# Patient Record
Sex: Female | Born: 1965 | Race: Black or African American | Hispanic: No | Marital: Single | Smoking: Never smoker
Health system: Southern US, Community
[De-identification: ages and names within clinical notes are randomized; demographics above are authoritative.]

## PROBLEM LIST (undated history)

## (undated) DIAGNOSIS — D509 Iron deficiency anemia, unspecified: Secondary | ICD-10-CM

## (undated) DIAGNOSIS — E119 Type 2 diabetes mellitus without complications: Secondary | ICD-10-CM

## (undated) DIAGNOSIS — I10 Essential (primary) hypertension: Secondary | ICD-10-CM

## (undated) HISTORY — PX: BACK SURGERY: SHX140

## (undated) HISTORY — DX: Type 2 diabetes mellitus without complications: E11.9

## (undated) HISTORY — DX: Iron deficiency anemia, unspecified: D50.9

## (undated) HISTORY — PX: CHOLECYSTECTOMY: SHX55

## (undated) HISTORY — PX: TUBAL LIGATION: SHX77

---

## 2012-05-16 ENCOUNTER — Emergency Department (HOSPITAL_BASED_OUTPATIENT_CLINIC_OR_DEPARTMENT_OTHER)
Admission: EM | Admit: 2012-05-16 | Discharge: 2012-05-16 | Disposition: A | Discharge status: Home / Self Care | Payer: BC Managed Care – PPO | Attending: Emergency Medicine | Admitting: Emergency Medicine

## 2012-05-16 ENCOUNTER — Encounter (HOSPITAL_BASED_OUTPATIENT_CLINIC_OR_DEPARTMENT_OTHER): Payer: Self-pay

## 2012-05-16 DIAGNOSIS — M549 Dorsalgia, unspecified: Secondary | ICD-10-CM | POA: Insufficient documentation

## 2012-05-16 DIAGNOSIS — I10 Essential (primary) hypertension: Secondary | ICD-10-CM | POA: Insufficient documentation

## 2012-05-16 DIAGNOSIS — E119 Type 2 diabetes mellitus without complications: Secondary | ICD-10-CM | POA: Insufficient documentation

## 2012-05-16 DIAGNOSIS — D649 Anemia, unspecified: Secondary | ICD-10-CM | POA: Insufficient documentation

## 2012-05-16 DIAGNOSIS — R1013 Epigastric pain: Secondary | ICD-10-CM | POA: Insufficient documentation

## 2012-05-16 DIAGNOSIS — Z79899 Other long term (current) drug therapy: Secondary | ICD-10-CM | POA: Insufficient documentation

## 2012-05-16 HISTORY — DX: Essential (primary) hypertension: I10

## 2012-05-16 HISTORY — DX: Type 2 diabetes mellitus without complications: E11.9

## 2012-05-16 LAB — COMPREHENSIVE METABOLIC PANEL
AST: 16 U/L (ref 0–37)
Albumin: 3.4 g/dL — ABNORMAL LOW (ref 3.5–5.2)
Alkaline Phosphatase: 78 U/L (ref 39–117)
CO2: 24 mEq/L (ref 19–32)
Chloride: 102 mEq/L (ref 96–112)
Potassium: 3.6 mEq/L (ref 3.5–5.1)
Total Bilirubin: 0.1 mg/dL — ABNORMAL LOW (ref 0.3–1.2)

## 2012-05-16 LAB — URINALYSIS, ROUTINE W REFLEX MICROSCOPIC
Glucose, UA: NEGATIVE mg/dL
Ketones, ur: NEGATIVE mg/dL
Protein, ur: 100 mg/dL — AB

## 2012-05-16 LAB — CBC WITH DIFFERENTIAL/PLATELET
Basophils Absolute: 0.1 10*3/uL (ref 0.0–0.1)
Eosinophils Absolute: 0.2 10*3/uL (ref 0.0–0.7)
HCT: 32 % — ABNORMAL LOW (ref 36.0–46.0)
Lymphs Abs: 3.1 10*3/uL (ref 0.7–4.0)
MCH: 22.1 pg — ABNORMAL LOW (ref 26.0–34.0)
MCHC: 32.5 g/dL (ref 30.0–36.0)
MCV: 67.9 fL — ABNORMAL LOW (ref 78.0–100.0)
Monocytes Absolute: 0.9 10*3/uL (ref 0.1–1.0)
Neutro Abs: 6.4 10*3/uL (ref 1.7–7.7)
RDW: 16.2 % — ABNORMAL HIGH (ref 11.5–15.5)

## 2012-05-16 LAB — URINE MICROSCOPIC-ADD ON

## 2012-05-16 LAB — TROPONIN I: Troponin I: <0.3 ng/mL (ref <0.30)

## 2012-05-16 MED ORDER — HYDROCODONE-ACETAMINOPHEN 5-325 MG PO TABS: 2.0000 | ORAL_TABLET | ORAL | Status: DC | PRN

## 2012-05-16 MED ORDER — OMEPRAZOLE 20 MG PO CPDR: 20.0000 mg | DELAYED_RELEASE_CAPSULE | Freq: Every day | ORAL | Status: DC

## 2012-05-16 NOTE — ED Notes (Signed)
Pt reports onset of left upper back pain 2 days ago and developed epigastric pain last night after eating ribs and potato salad.

## 2012-05-16 NOTE — ED Provider Notes (Signed)
History     CSN: [id number]  Arrival date & time 05/16/12  4098   First MD Initiated Contact with Patient 05/16/12 0840      Chief Complaint  Patient presents with  . Abdominal Pain  . Back Pain     HPI Patient comes in with two-day history of epigastric and back pain.  Patient has previous history of gallbladder disease but had her gallbladder removed.  She denies any fever chills.  She has noted some dark stools the last few days.  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  Patient denies any chest pain.  She did have a heavy meal of ribs and otitis last night prior to the problem. Past Medical History  Diagnosis Date  . Diabetes mellitus without complication   . Hypertension     Past Surgical History  Procedure Laterality Date  . Back surgery    . Cholecystectomy    . Cesarean section      No family history on file.  History  Substance Use Topics  . Smoking status: Never Smoker   . Smokeless tobacco: Not on file  . Alcohol Use: Yes     Comment: occasional    OB History   Grav Para Term Preterm Abortions TAB SAB Ect Mult Living                  Review of Systems Dark stools All other systems reviewed and are negative Allergies  Review of patient's allergies indicates no known allergies.  Home Medications   Current Outpatient Rx  Name  Route  Sig  Dispense  Refill  . HYDROcodone-acetaminophen (NORCO/VICODIN) 5-325 MG per tablet   Oral   Take 2 tablets by mouth every 4 (four) hours as needed for pain.   10 tablet   0   . LISINOPRIL PO   Oral   Take by mouth.         . METFORMIN HCL PO   Oral   Take by mouth.         Marland Kitchen omeprazole (PRILOSEC) 20 MG capsule   Oral   Take 1 capsule (20 mg total) by mouth daily.   30 capsule   0     BP 136/56  Pulse 65  Temp(Src) 97.9 F (36.6 C) (Oral)  Resp 18  Ht 5\' 1"  (1.549 m)  Wt 191 lb (86.637 kg)  BMI 36.11 kg/m2  SpO2 98%  Physical Exam  Nursing note and  vitals reviewed. Constitutional: She is oriented to person, place, and time. She appears well-developed and well-nourished. No distress.  HENT:  Head: Normocephalic and atraumatic.  Eyes: Pupils are equal, round, and reactive to light.  Neck: Normal range of motion.  Cardiovascular: Normal rate and intact distal pulses.   Pulmonary/Chest: No respiratory distress.  Abdominal: Soft. Normal appearance and bowel sounds are normal. She exhibits no distension. There is tenderness (Mild). There is no rebound and no guarding.    Mild  Musculoskeletal: Normal range of motion.  Neurological: She is alert and oriented to person, place, and time. No cranial nerve deficit.  Skin: Skin is warm and dry. No rash noted.  Psychiatric: She has a normal mood and affect. Her behavior is normal.    ED Course  Procedures (including critical care time)  Date: 05/16/2012  Rate: 66  Rhythm: normal sinus rhythm  QRS Axis: normal  Intervals: normal  ST/T Wave abnormalities: normal  Conduction Disutrbances: Forward  progression anteriorly  Narrative Interpretation: Nonspecific changes     Labs Reviewed  COMPREHENSIVE METABOLIC PANEL - Abnormal; Notable for the following:    Albumin 3.4 (*)    Total Bilirubin 0.1 (*)    All other components within normal limits  CBC WITH DIFFERENTIAL - Abnormal; Notable for the following:    WBC 10.7 (*)    Hemoglobin 10.4 (*)    HCT 32.0 (*)    MCV 67.9 (*)    MCH 22.1 (*)    RDW 16.2 (*)    Platelets 445 (*)    All other components within normal limits  URINALYSIS, ROUTINE W REFLEX MICROSCOPIC - Abnormal; Notable for the following:    Hgb urine dipstick TRACE (*)    Protein, ur 100 (*)    All other components within normal limits  URINE MICROSCOPIC-ADD ON - Abnormal; Notable for the following:    Squamous Epithelial / LPF FEW (*)    All other components within normal limits  LIPASE, BLOOD  TROPONIN I  OCCULT BLOOD X 1 CARD TO LAB, STOOL   No results  found.   1. Epigastric pain   2. Anemia       MDM   And states that she was told she had anemia in the past.       Nelia Shi, MD 05/18/12 1131

## 2012-05-17 ENCOUNTER — Telehealth: Payer: Self-pay | Admitting: Hematology & Oncology

## 2012-05-17 NOTE — Telephone Encounter (Signed)
Pt aware of 5-12 appointment. Mailed map, calendar to pt. MD aware it's 5-12

## 2012-06-25 ENCOUNTER — Other Ambulatory Visit (HOSPITAL_BASED_OUTPATIENT_CLINIC_OR_DEPARTMENT_OTHER): Payer: Self-pay | Admitting: Lab

## 2012-06-25 ENCOUNTER — Ambulatory Visit (HOSPITAL_BASED_OUTPATIENT_CLINIC_OR_DEPARTMENT_OTHER): Payer: Self-pay | Admitting: Hematology & Oncology

## 2012-06-25 ENCOUNTER — Telehealth: Payer: Self-pay | Admitting: Hematology & Oncology

## 2012-06-25 ENCOUNTER — Ambulatory Visit: Payer: Self-pay

## 2012-06-25 VITALS — BP 146/61 | HR 65 | Temp 98.0°F | Resp 16 | Ht 61.0 in | Wt 189.0 lb

## 2012-06-25 DIAGNOSIS — E119 Type 2 diabetes mellitus without complications: Secondary | ICD-10-CM

## 2012-06-25 DIAGNOSIS — N189 Chronic kidney disease, unspecified: Secondary | ICD-10-CM

## 2012-06-25 DIAGNOSIS — D631 Anemia in chronic kidney disease: Secondary | ICD-10-CM

## 2012-06-25 DIAGNOSIS — D509 Iron deficiency anemia, unspecified: Secondary | ICD-10-CM

## 2012-06-25 LAB — CBC WITH DIFFERENTIAL (CANCER CENTER ONLY)
BASO#: 0 10*3/uL (ref 0.0–0.2)
Eosinophils Absolute: 0.3 10*3/uL (ref 0.0–0.5)
HGB: 10.3 g/dL — ABNORMAL LOW (ref 11.6–15.9)
LYMPH%: 28.6 % (ref 14.0–48.0)
MCH: 22.2 pg — ABNORMAL LOW (ref 26.0–34.0)
MCV: 71 fL — ABNORMAL LOW (ref 81–101)
MONO%: 6 % (ref 0.0–13.0)
NEUT%: 62 % (ref 39.6–80.0)
Platelets: 424 10*3/uL — ABNORMAL HIGH (ref 145–400)
RBC: 4.64 10*6/uL (ref 3.70–5.32)

## 2012-06-25 NOTE — Progress Notes (Signed)
This office note has been dictated.

## 2012-06-25 NOTE — Telephone Encounter (Signed)
Pt here today and had no insurance information to provide. However, pt said she has applied for [employer] insurance w an efc date of 05/15/2012, but has not gotten the card as of date. They told her they are behind on sending the elig cards. Pt was offered the EPP and MCD assistance program and said she will let me know when she wants to apply.

## 2012-06-26 LAB — FERRITIN: Ferritin: 38 ng/mL (ref 10–291)

## 2012-06-26 LAB — ERYTHROPOIETIN: Erythropoietin: 11.5 m[IU]/mL (ref 2.6–18.5)

## 2012-06-26 LAB — IRON AND TIBC: UIBC: 275 ug/dL (ref 125–400)

## 2012-06-27 NOTE — Progress Notes (Signed)
CC:   Bernerd Limbo, MD, Fax [phone]  DIAGNOSIS:  Microcytic anemia.  HISTORY OF PRESENT ILLNESS:  Michelle Mercer is a very nice 47 year old African American female.  She has had diabetes now for over 20 years. She is on insulin and metformin.  She has had no kidney issues as far as she knows.  She has had no neuropathy.  She has been found to have some anemia.  She is followed by Dr. Bernerd Limbo at [facility].  She has been noted to have some anemia.  As such, it is felt that she needed to be evaluated for this.  She says she is going to, I think, [facility] for an upper endoscopy and lower endoscopy within the month.  Going back to December 2013, a CBC was done which showed white cell count 6.6, hemoglobin 10.7, hematocrit 34, and platelet count was 458. MCV was 68.  She had normal BUN and creatinine.  Again, she has had no obvious issues with bleeding or bruising.  On in March 2014, her hemoglobin was 10.9 and hematocrit 34.2.  MCV was 67.  Platelet count was 443.  She was started on some oral iron.  Again, she has had no change in bowel or bladder habits.  There has been no melena or bright red blood per rectum.  She has had no weight loss or weight gain.  She has had no obvious thyroid issues.  She has had no fever, sweats, or chills.  As far as she knows, she does not have sickle cell.  She has had 2 children and they, I am sure, were tested and they did not show any sickle cell.  She does not chew ice.  She has no "cravings."  She does not have her monthly cycles.  She says she may have 1 a year.  Again, she was kindly referred to the [facility] for an evaluation.  PAST MEDICAL HISTORY: 1. Insulin-dependent diabetes. 2. Cholecystectomy. 3. Hyperlipidemia. 4. Hypertension.  ALLERGIES: 1. Bactrim. 2. Lisinopril. 3. Prevacid.  MEDICATIONS: 1. Vitamin B12 1000 mcg p.o. daily. 2. Humalog (75/25) 40 units subcu q.a.m. and  80 units subcu q.p.m. 3. Lisinopril 20 mg p.o. daily. 4. Metformin ER 1000 mg p.o. b.i.d.  SOCIAL HISTORY:  Negative for tobacco use.  She has rare alcohol use. She has no obvious occupational exposures.  FAMILY HISTORY:  Remarkable for diabetes on her father's side of the family.  There is a history of CVA.  REVIEW OF SYSTEMS:  There are no additional findings as noted in the history present illness.  PHYSICAL EXAMINATION:  General:  This is a well-developed, well- nourished African American female in no obvious distress.  Vital signs: Temperature of 98, pulse 65, respiratory rate 16, blood pressure 146/61. Weight is 189.  Head and neck:  Normocephalic, atraumatic skull.  There are no ocular or oral lesions.  There are no palpable cervical or supraclavicular lymph nodes.  Lungs:  Clear to percussion and auscultation bilaterally.  Cardiac:  Regular rate and rhythm with a normal S1 and S2.  There are no murmurs, rubs, or bruits.  Abdomen: Soft with good bowel sounds.  There is no palpable abdominal mass. There is no fluid wave.  There is no palpable hepatosplenomegaly.  Back: No tenderness over the spine, ribs, or hips.  Extremities:  No clubbing, cyanosis, or edema.  Neurological:  No focal neurological deficits.  LABORATORY STUDIES:  White cell count is 10.5, hemoglobin 10.3, hematocrit  32.9, platelet count 424.  MCV is 71.  Peripheral smear shows mild anisocytosis.  She has microcytic red cells. There are no schistocytes.  There may be a rare target cell.  I see no spherocytes.  She has no nucleated red cells of teardrop cells.  There is no rouleaux formation.  White cells are adequate in maturity.  There are no hypersegmented polys.  I see no blasts.  There are no immature myeloid cells.  Lymphocytes do not see any atypical lymphocytes. Platelets are adequate in number and size.  IMPRESSION:  Michelle Mercer is a 47 year old African American female with microcytic anemia.  One  has to believe that this is going to be iron deficiency.  I do not see anything that would suggest sickle cell.  I did send off a hemoglobin electrophoresis on her.  If she does have iron deficiency, we will go ahead and give her IV iron.  She is taking oral iron right now.  I really do not see that this is helping her out.  I do not see a need for a bone marrow biopsy at this point in time.  I cannot find anything on her physical or on her blood smear that would suggest an actual bone marrow disorder.  We will go ahead and plan to get her back once we know what her iron studies look like.  Again, if her iron is low, we can get her in this week or next for IV iron.  Michelle Mercer was very charming.  I so enjoyed talking with her.  We likely will plan to get her back in 6-8 weeks so that we can follow up with her blood.  I think the "?" is going to be whether or not she has a low erythropoietin level.  I will add this to her lab work as if she may need ESA intervention if we cannot get her anemia improved with iron alone.    ______________________________ Josph Macho, M.D. PRE/MEDQ  D:  06/25/2012  T:  06/26/2012  Job:  [id number]

## 2012-07-10 ENCOUNTER — Telehealth: Payer: Self-pay | Admitting: Oncology

## 2012-07-10 ENCOUNTER — Encounter: Payer: Self-pay | Admitting: Hematology & Oncology

## 2012-07-10 ENCOUNTER — Telehealth: Payer: Self-pay | Admitting: Hematology & Oncology

## 2012-07-10 ENCOUNTER — Other Ambulatory Visit: Payer: Self-pay | Admitting: Hematology & Oncology

## 2012-07-10 DIAGNOSIS — E119 Type 2 diabetes mellitus without complications: Secondary | ICD-10-CM

## 2012-07-10 DIAGNOSIS — D509 Iron deficiency anemia, unspecified: Secondary | ICD-10-CM

## 2012-07-10 HISTORY — DX: Iron deficiency anemia, unspecified: D50.9

## 2012-07-10 HISTORY — DX: Type 2 diabetes mellitus without complications: E11.9

## 2012-07-10 NOTE — Telephone Encounter (Addendum)
Message copied by Lacie Draft on Tue Jul 10, 2012  3:06 PM ------      Message from: Josph Macho      Created: Tue Jul 10, 2012  7:38 AM       Please call and let her know that her iron is low. We need to give her a dose of IV iron. I have already put the orders in. Thanks for letting her know. Pete -----Spoke with patient, will schedule today.

## 2012-07-10 NOTE — Telephone Encounter (Signed)
Per Ginger (Rn) to sch iron apt.  i spoke with patient and she wanted to be sch on 08/22/12 because she will be off of work that day

## 2012-07-23 LAB — HM COLONOSCOPY

## 2012-08-08 ENCOUNTER — Emergency Department (HOSPITAL_BASED_OUTPATIENT_CLINIC_OR_DEPARTMENT_OTHER): Payer: BC Managed Care – PPO

## 2012-08-08 ENCOUNTER — Emergency Department (HOSPITAL_BASED_OUTPATIENT_CLINIC_OR_DEPARTMENT_OTHER)
Admission: EM | Admit: 2012-08-08 | Discharge: 2012-08-08 | Disposition: A | Discharge status: Home / Self Care | Payer: BC Managed Care – PPO | Attending: Emergency Medicine | Admitting: Emergency Medicine

## 2012-08-08 ENCOUNTER — Encounter (HOSPITAL_BASED_OUTPATIENT_CLINIC_OR_DEPARTMENT_OTHER): Payer: Self-pay | Admitting: Emergency Medicine

## 2012-08-08 DIAGNOSIS — I10 Essential (primary) hypertension: Secondary | ICD-10-CM | POA: Insufficient documentation

## 2012-08-08 DIAGNOSIS — J40 Bronchitis, not specified as acute or chronic: Secondary | ICD-10-CM

## 2012-08-08 DIAGNOSIS — D509 Iron deficiency anemia, unspecified: Secondary | ICD-10-CM | POA: Insufficient documentation

## 2012-08-08 DIAGNOSIS — Z79899 Other long term (current) drug therapy: Secondary | ICD-10-CM | POA: Insufficient documentation

## 2012-08-08 DIAGNOSIS — E119 Type 2 diabetes mellitus without complications: Secondary | ICD-10-CM | POA: Insufficient documentation

## 2012-08-08 DIAGNOSIS — Z794 Long term (current) use of insulin: Secondary | ICD-10-CM | POA: Insufficient documentation

## 2012-08-08 DIAGNOSIS — J209 Acute bronchitis, unspecified: Secondary | ICD-10-CM | POA: Insufficient documentation

## 2012-08-08 MED ORDER — HYDROCODONE-HOMATROPINE 5-1.5 MG/5ML PO SYRP: 5.0000 mL | ORAL_SOLUTION | ORAL | Status: DC | PRN

## 2012-08-08 MED ORDER — AZITHROMYCIN 250 MG PO TABS: 250.0000 mg | ORAL_TABLET | Freq: Every day | ORAL | Status: DC

## 2012-08-08 NOTE — ED Provider Notes (Signed)
History    CSN: [id number] Arrival date & time 08/08/12  6213  First MD Initiated Contact with Patient 08/08/12 1953     Chief Complaint  Patient presents with  . Cough   (Consider location/radiation/quality/duration/timing/severity/associated sxs/prior Treatment) Patient is a 47 y.o. female presenting with cough. The history is provided by the patient. No language interpreter was used.  Cough Cough characteristics:  Productive Sputum characteristics:  Nondescript Severity:  Moderate Duration:  1 week Timing:  Constant Progression:  Worsening Chronicity:  New Smoker: no   Relieved by:  Nothing Worsened by:  Nothing tried Ineffective treatments:  None tried Associated symptoms: no chest pain and no fever   Risk factors: no chemical exposure and no recent travel    Past Medical History  Diagnosis Date  . Diabetes mellitus without complication   . Hypertension   . Iron deficiency anemia, unspecified 07/10/2012  . Type II or unspecified type diabetes mellitus without mention of complication, not stated as uncontrolled 07/10/2012   Past Surgical History  Procedure Laterality Date  . Back surgery    . Cholecystectomy    . Cesarean section     No family history on file. History  Substance Use Topics  . Smoking status: Never Smoker   . Smokeless tobacco: Not on file  . Alcohol Use: Yes     Comment: occasional   OB History   Grav Para Term Preterm Abortions TAB SAB Ect Mult Living                 Review of Systems  Constitutional: Negative for fever.  Respiratory: Positive for cough.   Cardiovascular: Negative for chest pain.  All other systems reviewed and are negative.    Allergies  Review of patient's allergies indicates no known allergies.  Home Medications   Current Outpatient Rx  Name  Route  Sig  Dispense  Refill  . Cyanocobalamin (VITAMIN B 12 PO)   Oral   Take by mouth every morning.         . insulin lispro protamine-lispro (HUMALOG 75/25)  (75-25) 100 UNIT/ML SUSP   Subcutaneous   Inject into the skin 2 (two) times daily with a meal. 40 units in AM and 80 units PM         . LISINOPRIL PO   Oral   Take by mouth every morning.          Marland Kitchen METFORMIN HCL PO   Oral   Take by mouth 2 (two) times daily. 2 tabs twice a day.         . Iron 66 MG TABS   Oral   Take by mouth 3 (three) times daily.          BP 170/86  Pulse 79  Temp(Src) 99.4 F (37.4 C) (Oral)  Resp 18  Ht 5\' 1"  (1.549 m)  Wt 189 lb (85.73 kg)  BMI 35.73 kg/m2  SpO2 97% Physical Exam  Nursing note and vitals reviewed. Constitutional: She appears well-developed and well-nourished.  HENT:  Head: Normocephalic and atraumatic.  Eyes: Pupils are equal, round, and reactive to light.  Neck: Normal range of motion.  Cardiovascular: Normal rate.   Pulmonary/Chest: Effort normal and breath sounds normal.  Abdominal: Soft.  Neurological: She is alert.  Skin: Skin is warm.  Psychiatric: She has a normal mood and affect.    ED Course  Procedures (including critical care time) Labs Reviewed - No data to display Dg Chest 2 View  08/08/2012   *  RADIOLOGY REPORT*  Clinical Data: Cough  CHEST - 2 VIEW  Comparison: None  Findings: Mild cardiac enlargement without heart failure or effusion.  Negative for pulmonary edema.  Negative for pneumonia. Mild left lower lobe atelectasis.  IMPRESSION: Mild left lower lobe atelectasis.  Mild cardiac enlargement without heart failure.   Original Report Authenticated By: Janeece Riggers, M.D.   No diagnosis found.  MDM  Pt given rx for zithromax and hydromet.  Pt advised to follow up with her MD for recheck next week if symptoms persist  Elson Areas, [place] 08/08/12 2031

## 2012-08-08 NOTE — ED Notes (Signed)
Pt c/o intermittent productive cough x 1 week.

## 2012-08-09 NOTE — ED Provider Notes (Signed)
Medical screening examination/treatment/procedure(s) were performed by non-physician practitioner and as supervising physician I was immediately available for consultation/collaboration.   Kymorah Korf, MD 08/09/12 2348 

## 2012-08-22 ENCOUNTER — Ambulatory Visit (HOSPITAL_BASED_OUTPATIENT_CLINIC_OR_DEPARTMENT_OTHER): Payer: BC Managed Care – PPO

## 2012-08-22 VITALS — BP 132/78 | HR 67 | Temp 97.0°F | Resp 16

## 2012-08-22 DIAGNOSIS — D509 Iron deficiency anemia, unspecified: Secondary | ICD-10-CM

## 2012-08-22 MED ORDER — SODIUM CHLORIDE 0.9 % IV SOLN
1020.0000 mg | Freq: Once | INTRAVENOUS | Status: AC
  Administered 2012-08-22: 1020 mg via INTRAVENOUS
  Filled 2012-08-22: qty 34

## 2012-08-22 MED ORDER — SODIUM CHLORIDE 0.9 % IV SOLN
Freq: Once | INTRAVENOUS | Status: AC
  Administered 2012-08-22: 14:00:00 via INTRAVENOUS

## 2012-08-22 NOTE — Patient Instructions (Addendum)
Ferumoxytol injection What is this medicine? FERUMOXYTOL is an iron complex. Iron is used to make healthy red blood cells, which carry oxygen and nutrients throughout the body. This medicine is used to treat iron deficiency anemia in people with chronic kidney disease. This medicine may be used for other purposes; ask your health care provider or pharmacist if you have questions. What should I tell my health care provider before I take this medicine? They need to know if you have any of these conditions: -anemia not caused by low iron levels -high levels of iron in the blood -magnetic resonance imaging (MRI) test scheduled -an unusual or allergic reaction to iron, other medicines, foods, dyes, or preservatives -pregnant or trying to get pregnant -breast-feeding How should I use this medicine? This medicine is for infusion into a vein. It is given by a health care professional in a hospital or clinic setting. Talk to your pediatrician regarding the use of this medicine in children. Special care may be needed. Overdosage: If you think you've taken too much of this medicine contact a poison control center or emergency room at once. Overdosage: If you think you have taken too much of this medicine contact a poison control center or emergency room at once. NOTE: This medicine is only for you. Do not share this medicine with others. What if I miss a dose? It is important not to miss your dose. Call your doctor or health care professional if you are unable to keep an appointment. What may interact with this medicine? This medicine may interact with the following medications: -other iron products This list may not describe all possible interactions. Give your health care provider a list of all the medicines, herbs, non-prescription drugs, or dietary supplements you use. Also tell them if you smoke, drink alcohol, or use illegal drugs. Some items may interact with your medicine. What should I watch  for while using this medicine? Visit your doctor or healthcare professional regularly. Tell your doctor or healthcare professional if your symptoms do not start to get better or if they get worse. You may need blood work done while you are taking this medicine. You may need to follow a special diet. Talk to your doctor. Foods that contain iron include: whole grains/cereals, dried fruits, beans, or peas, leafy green vegetables, and organ meats (liver, kidney). What side effects may I notice from receiving this medicine? Side effects that you should report to your doctor or health care professional as soon as possible: -allergic reactions like skin rash, itching or hives, swelling of the face, lips, or tongue -breathing problems -changes in blood pressure -feeling faint or lightheaded, falls -fever or chills -flushing, sweating, or hot feelings -swelling of the ankles or feet Side effects that usually do not require medical attention (Report these to your doctor or health care professional if they continue or are bothersome.): -diarrhea -headache -nausea, vomiting -stomach pain This list may not describe all possible side effects. Call your doctor for medical advice about side effects. You may report side effects to FDA at 1-800-FDA-1088. Where should I keep my medicine? This drug is given in a hospital or clinic and will not be stored at home. NOTE: This sheet is a summary. It may not cover all possible information. If you have questions about this medicine, talk to your doctor, pharmacist, or health care provider.  2012, Elsevier/Gold Standard. (10/24/2007 9:48:25 PM) 

## 2013-04-27 ENCOUNTER — Emergency Department (HOSPITAL_BASED_OUTPATIENT_CLINIC_OR_DEPARTMENT_OTHER)
Admission: EM | Admit: 2013-04-27 | Discharge: 2013-04-27 | Disposition: A | Discharge status: Home / Self Care | Payer: No Typology Code available for payment source | Attending: Emergency Medicine | Admitting: Emergency Medicine

## 2013-04-27 ENCOUNTER — Encounter (HOSPITAL_BASED_OUTPATIENT_CLINIC_OR_DEPARTMENT_OTHER): Payer: Self-pay | Admitting: Emergency Medicine

## 2013-04-27 DIAGNOSIS — Z792 Long term (current) use of antibiotics: Secondary | ICD-10-CM | POA: Insufficient documentation

## 2013-04-27 DIAGNOSIS — I10 Essential (primary) hypertension: Secondary | ICD-10-CM | POA: Insufficient documentation

## 2013-04-27 DIAGNOSIS — Z794 Long term (current) use of insulin: Secondary | ICD-10-CM | POA: Insufficient documentation

## 2013-04-27 DIAGNOSIS — D509 Iron deficiency anemia, unspecified: Secondary | ICD-10-CM | POA: Insufficient documentation

## 2013-04-27 DIAGNOSIS — H109 Unspecified conjunctivitis: Secondary | ICD-10-CM | POA: Insufficient documentation

## 2013-04-27 DIAGNOSIS — Z79899 Other long term (current) drug therapy: Secondary | ICD-10-CM | POA: Insufficient documentation

## 2013-04-27 DIAGNOSIS — R6889 Other general symptoms and signs: Secondary | ICD-10-CM | POA: Insufficient documentation

## 2013-04-27 DIAGNOSIS — E119 Type 2 diabetes mellitus without complications: Secondary | ICD-10-CM | POA: Insufficient documentation

## 2013-04-27 MED ORDER — TOBRAMYCIN-DEXAMETHASONE 0.3-0.1 % OP SUSP: 1.0000 [drp] | OPHTHALMIC | Status: DC

## 2013-04-27 MED ORDER — CETIRIZINE HCL 10 MG PO TABS: 10.0000 mg | ORAL_TABLET | Freq: Every day | ORAL | Status: DC | PRN

## 2013-04-27 NOTE — ED Notes (Signed)
Patient here with bilateral eye drainage and itching x 3 days. 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 Denies pain, no trauma

## 2013-04-27 NOTE — ED Provider Notes (Signed)
CSN: [id number]     Arrival date & time 04/27/13  0804 History   First MD Initiated Contact with Patient 04/27/13 [phone]     Chief Complaint  Patient presents with  . Eye Drainage      HPI  Patient states her left eye was itching to 4 days ago. The next day her right eye was itching and notable training. He binges clear tears. This morning she had yellow thick mattering from her left eye and has become more painful. 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 He has had a runny nose. No cough. No skin rash or itching.  Past Medical History  Diagnosis Date  . Diabetes mellitus without complication   . Hypertension   . Iron deficiency anemia, unspecified 07/10/2012  . Type II or unspecified type diabetes mellitus without mention of complication, not stated as uncontrolled 07/10/2012   Past Surgical History  Procedure Laterality Date  . Back surgery    . Cholecystectomy    . Cesarean section     History reviewed. No pertinent family history. History  Substance Use Topics  . Smoking status: Never Smoker   . Smokeless tobacco: Not on file  . Alcohol Use: Yes     Comment: occasional   OB History   Grav Para Term Preterm Abortions TAB SAB Ect Mult Living                 Review of Systems  Constitutional: Negative for fever, chills, diaphoresis, appetite change and fatigue.  HENT: Positive for rhinorrhea. Negative for mouth sores, sore throat and trouble swallowing.   Eyes: Positive for photophobia, discharge, redness and itching. Negative for pain and visual disturbance.  Respiratory: Negative for cough, chest tightness, shortness of breath and wheezing.   Cardiovascular: Negative for chest pain.  Gastrointestinal: Negative for nausea, vomiting, abdominal pain, diarrhea and abdominal distention.  Endocrine: Negative for polydipsia, polyphagia and polyuria.  Genitourinary: Negative for dysuria, frequency and hematuria.  Musculoskeletal: Negative for gait  problem.  Skin: Negative for color change, pallor and rash.  Neurological: Negative for dizziness, syncope, light-headedness and headaches.  Hematological: Does not bruise/bleed easily.  Psychiatric/Behavioral: Negative for behavioral problems and confusion.      Allergies  Review of patient's allergies indicates no known allergies.  Home Medications   Current Outpatient Rx  Name  Route  Sig  Dispense  Refill  . cetirizine (ZYRTEC) 10 MG tablet   Oral   Take 1 tablet (10 mg total) by mouth daily as needed for allergies.   30 tablet   0   . Cyanocobalamin (VITAMIN B 12 PO)   Oral   Take by mouth every morning.         . insulin lispro protamine-lispro (HUMALOG 75/25) (75-25) 100 UNIT/ML SUSP   Subcutaneous   Inject into the skin 2 (two) times daily with a meal. 40 units in AM and 80 units PM         . LISINOPRIL PO   Oral   Take by mouth every morning.          Marland Kitchen. METFORMIN HCL PO   Oral   Take by mouth 2 (two) times daily. 2 tabs twice a day.         . tobramycin-dexamethasone (TOBRADEX) ophthalmic solution   Both Eyes   Place 1 drop into both eyes every 4 (four) hours while awake.   5 mL   0    BP 161/67  Pulse 60  Temp(Src) 98.1 F (36.7 C)  Resp 20  Ht 5\' 1"  (1.549 m)  Wt 176 lb (79.833 kg)  BMI 33.27 kg/m2  SpO2 100% Physical Exam  HENT:  Bilateral bulbar and palpebral conjunctival injection. No lid margin swelling or lid swelling. No periorbital swelling. No extraocular movement entrapment. No proptosis or enophthalmos. No preauricular adenopathy.    ED Course  Procedures (including critical care time) Labs Review Labs Reviewed - No data to display Imaging Review No results found.   EKG Interpretation None      MDM   Final diagnoses:  Conjunctivitis    No localizing infection with stye or chalazion.  Normal vision. No purulent edema or cellulitis. Allergic symptoms with runny nose. Eyes TobraDex, and Zyrtec.    Rolland Porter,  MD 04/27/13 [phone]

## 2013-04-27 NOTE — Discharge Instructions (Signed)

## 2013-06-11 ENCOUNTER — Emergency Department (HOSPITAL_BASED_OUTPATIENT_CLINIC_OR_DEPARTMENT_OTHER)
Admission: EM | Admit: 2013-06-11 | Discharge: 2013-06-11 | Disposition: A | Discharge status: Home / Self Care | Payer: No Typology Code available for payment source | Attending: Emergency Medicine | Admitting: Emergency Medicine

## 2013-06-11 ENCOUNTER — Encounter (HOSPITAL_BASED_OUTPATIENT_CLINIC_OR_DEPARTMENT_OTHER): Payer: Self-pay | Admitting: Emergency Medicine

## 2013-06-11 DIAGNOSIS — D509 Iron deficiency anemia, unspecified: Secondary | ICD-10-CM | POA: Insufficient documentation

## 2013-06-11 DIAGNOSIS — R1110 Vomiting, unspecified: Secondary | ICD-10-CM | POA: Insufficient documentation

## 2013-06-11 DIAGNOSIS — Z79899 Other long term (current) drug therapy: Secondary | ICD-10-CM | POA: Insufficient documentation

## 2013-06-11 DIAGNOSIS — I10 Essential (primary) hypertension: Secondary | ICD-10-CM | POA: Insufficient documentation

## 2013-06-11 DIAGNOSIS — Z794 Long term (current) use of insulin: Secondary | ICD-10-CM | POA: Insufficient documentation

## 2013-06-11 DIAGNOSIS — E119 Type 2 diabetes mellitus without complications: Secondary | ICD-10-CM | POA: Insufficient documentation

## 2013-06-11 DIAGNOSIS — J069 Acute upper respiratory infection, unspecified: Secondary | ICD-10-CM | POA: Insufficient documentation

## 2013-06-11 LAB — RAPID STREP SCREEN (MED CTR MEBANE ONLY): Streptococcus, Group A Screen (Direct): NEGATIVE

## 2013-06-11 MED ORDER — AZITHROMYCIN 250 MG PO TABS
500.0000 mg | ORAL_TABLET | Freq: Once | ORAL | Status: AC
  Administered 2013-06-11: 500 mg via ORAL
  Filled 2013-06-11: qty 2

## 2013-06-11 MED ORDER — AZITHROMYCIN 250 MG PO TABS: 250.0000 mg | ORAL_TABLET | Freq: Every day | ORAL | Status: DC

## 2013-06-11 MED ORDER — GUAIFENESIN-CODEINE 100-10 MG/5ML PO SOLN: 5.0000 mL | ORAL | Status: DC | PRN

## 2013-06-11 MED ORDER — ONDANSETRON 4 MG PO TBDP
4.0000 mg | ORAL_TABLET | Freq: Once | ORAL | Status: AC
  Administered 2013-06-11: 4 mg via ORAL
  Filled 2013-06-11: qty 1

## 2013-06-11 MED ORDER — IBUPROFEN 800 MG PO TABS
800.0000 mg | ORAL_TABLET | Freq: Once | ORAL | Status: AC
  Administered 2013-06-11: 800 mg via ORAL
  Filled 2013-06-11: qty 1

## 2013-06-11 MED ORDER — ONDANSETRON HCL 4 MG PO TABS: 4.0000 mg | ORAL_TABLET | Freq: Four times a day (QID) | ORAL | Status: DC

## 2013-06-11 NOTE — ED Provider Notes (Signed)
TIME SEEN: 10:25 PM  CHIEF COMPLAINT: sore throat   HPI: HPI Comments: [name] is a 48 y.o. female with history of diabetes, anemia, and HTN who presents to the Emergency Department complaining of sore throat, cough, posttussive emesis, rhinorrhea and congestion ongoing for two weeks. She has not been evaluated by a physician for this. She has not been on antibiotics. No chest pain, shortness of breath, abdominal pain, diarrhea.   ROS: See HPI Constitutional: no fever  Eyes: no drainage  ENT:  runny nose   Cardiovascular:  no chest pain  Resp: no SOB  GI: no vomiting GU: no dysuria Integumentary: no rash  Allergy: no hives  Musculoskeletal: no leg swelling  Neurological: no slurred speech ROS otherwise negative  PAST MEDICAL HISTORY/PAST SURGICAL HISTORY:  Past Medical History  Diagnosis Date  . Diabetes mellitus without complication   . Hypertension   . Iron deficiency anemia, unspecified 07/10/2012  . Type II or unspecified type diabetes mellitus without mention of complication, not stated as uncontrolled 07/10/2012    MEDICATIONS:  Prior to Admission medications   Medication Sig Start Date End Date Taking? Authorizing Provider  cetirizine (ZYRTEC) 10 MG tablet Take 1 tablet (10 mg total) by mouth daily as needed for allergies. 04/27/13   Rolland PorterMark James, MD  Cyanocobalamin (VITAMIN B 12 PO) Take by mouth every morning.    Historical Provider, MD  insulin lispro protamine-lispro (HUMALOG 75/25) (75-25) 100 UNIT/ML SUSP Inject into the skin 2 (two) times daily with a meal. 40 units in AM and 80 units PM    Historical Provider, MD  LISINOPRIL PO Take by mouth every morning.     Historical Provider, MD  METFORMIN HCL PO Take by mouth 2 (two) times daily. 2 tabs twice a day.    Historical Provider, MD  tobramycin-dexamethasone [facility](TOBRADEX) ophthalmic solution Place 1 drop into both eyes every 4 (four) hours while awake. 04/27/13   Rolland PorterMark James, MD    ALLERGIES:  No Known  Allergies  SOCIAL HISTORY:  History  Substance Use Topics  . Smoking status: Never Smoker   . Smokeless tobacco: Not on file  . Alcohol Use: Yes     Comment: occasional    FAMILY HISTORY: No family history on file.  EXAM: BP 150/72  Pulse 68  Temp(Src) 98.6 F (37 C) (Oral)  Resp 18  Ht 5\' 1"  (1.549 m)  Wt 180 lb (81.647 kg)  BMI 34.03 kg/m2  SpO2 99% CONSTITUTIONAL: Alert and oriented and responds appropriately to questions. Well-appearing; well-nourished HEAD: Normocephalic EYES: Conjunctivae clear, PERRL ENT: normal nose; no rhinorrhea; moist mucous membranes; pharynx without lesions noted Nasal congestion, oropharynx erythema ; No tonsilar hypertrophy no exudate.  No trismus or drooling. No uvular deviation. NECK: Supple, no meningismus, no LAD  CARD: RRR; S1 and S2 appreciated; no murmurs, no clicks, no rubs, no gallops RESP: Normal chest excursion without splinting or tachypnea; breath sounds clear and equal bilaterally; no wheezes, no rhonchi, no rales, no respiratory distress or hypoxia ABD/GI: Normal bowel sounds; non-distended; soft, non-tender, no rebound, no guarding BACK:  The back appears normal and is non-tender to palpation, there is no CVA tenderness EXT: Normal ROM in all joints; non-tender to palpation; no edema; normal capillary refill; no cyanosis    SKIN: Normal color for age and race; warm NEURO: Moves all extremities equally PSYCH: The patient's mood and manner are appropriate. Grooming and personal hygiene are appropriate.  MEDICAL DECISION MAKING: Patient here with cough, congestion, sore throat.  Her strep test is negative. Given patient has had symptoms for 2 weeks, will treat with azithromycin which have discussed with patient what treat sinusitis as well as community-acquired pneumonia. Have offered chest x-ray but given we're going to treat her for possible pneumonia, patient declines. We'll discharge with prescription for Zofran although suspect  her vomiting is posttussive. Her exam is otherwise benign she is hemodynamically stable. She is also requesting a work note. Have discussed return precautions. Patient verbalizes understanding and is comfortable with this plan.     Layla MawKristen N Alecxander Mainwaring, DO 06/11/13 2354

## 2013-06-11 NOTE — ED Notes (Signed)
Sore throat x 2 weeks. Cough til she gags and spits up. Ribs hurt from coughing. Runny nose. Watery eyes.

## 2013-06-11 NOTE — Discharge Instructions (Signed)

## 2013-06-13 LAB — CULTURE, GROUP A STREP

## 2013-11-06 ENCOUNTER — Encounter (HOSPITAL_BASED_OUTPATIENT_CLINIC_OR_DEPARTMENT_OTHER): Payer: Self-pay | Admitting: Emergency Medicine

## 2013-11-06 ENCOUNTER — Emergency Department (HOSPITAL_BASED_OUTPATIENT_CLINIC_OR_DEPARTMENT_OTHER)
Admission: EM | Admit: 2013-11-06 | Discharge: 2013-11-06 | Disposition: A | Discharge status: Home / Self Care | Payer: No Typology Code available for payment source | Attending: Emergency Medicine | Admitting: Emergency Medicine

## 2013-11-06 DIAGNOSIS — Z794 Long term (current) use of insulin: Secondary | ICD-10-CM | POA: Diagnosis not present

## 2013-11-06 DIAGNOSIS — R42 Dizziness and giddiness: Secondary | ICD-10-CM | POA: Diagnosis not present

## 2013-11-06 DIAGNOSIS — I10 Essential (primary) hypertension: Secondary | ICD-10-CM | POA: Diagnosis not present

## 2013-11-06 DIAGNOSIS — R7309 Other abnormal glucose: Secondary | ICD-10-CM | POA: Diagnosis present

## 2013-11-06 DIAGNOSIS — Z79899 Other long term (current) drug therapy: Secondary | ICD-10-CM | POA: Insufficient documentation

## 2013-11-06 DIAGNOSIS — R739 Hyperglycemia, unspecified: Secondary | ICD-10-CM

## 2013-11-06 DIAGNOSIS — E119 Type 2 diabetes mellitus without complications: Secondary | ICD-10-CM | POA: Insufficient documentation

## 2013-11-06 DIAGNOSIS — Z862 Personal history of diseases of the blood and blood-forming organs and certain disorders involving the immune mechanism: Secondary | ICD-10-CM | POA: Insufficient documentation

## 2013-11-06 LAB — CBC WITH DIFFERENTIAL/PLATELET
BASOS PCT: 1 % (ref 0–1)
Basophils Absolute: 0.1 10*3/uL (ref 0.0–0.1)
Eosinophils Absolute: 0.2 10*3/uL (ref 0.0–0.7)
Eosinophils Relative: 3 % (ref 0–5)
HCT: 40 % (ref 36.0–46.0)
HEMOGLOBIN: 13.2 g/dL (ref 12.0–15.0)
Lymphocytes Relative: 30 % (ref 12–46)
Lymphs Abs: 2.3 10*3/uL (ref 0.7–4.0)
MCH: 22.8 pg — AB (ref 26.0–34.0)
MCHC: 33 g/dL (ref 30.0–36.0)
MCV: 69.1 fL — AB (ref 78.0–100.0)
MONO ABS: 0.5 10*3/uL (ref 0.1–1.0)
MONOS PCT: 7 % (ref 3–12)
Neutro Abs: 4.6 10*3/uL (ref 1.7–7.7)
Neutrophils Relative %: 59 % (ref 43–77)
Platelets: 374 10*3/uL (ref 150–400)
RBC: 5.79 MIL/uL — ABNORMAL HIGH (ref 3.87–5.11)
RDW: 15.3 % (ref 11.5–15.5)
WBC: 7.7 10*3/uL (ref 4.0–10.5)

## 2013-11-06 LAB — CBG MONITORING, ED
Glucose-Capillary: 366 mg/dL — ABNORMAL HIGH (ref 70–99)
Glucose-Capillary: 277 mg/dL — ABNORMAL HIGH (ref 70–99)

## 2013-11-06 LAB — URINALYSIS, ROUTINE W REFLEX MICROSCOPIC
Bilirubin Urine: NEGATIVE
Glucose, UA: >1000 mg/dL — AB
Hgb urine dipstick: NEGATIVE
KETONES UR: 40 mg/dL — AB
LEUKOCYTES UA: NEGATIVE
NITRITE: NEGATIVE
PH: 5.5 (ref 5.0–8.0)
Protein, ur: 100 mg/dL — AB
Specific Gravity, Urine: 1.034 — ABNORMAL HIGH (ref 1.005–1.030)
Urobilinogen, UA: 0.2 mg/dL (ref 0.0–1.0)

## 2013-11-06 LAB — BASIC METABOLIC PANEL
Anion gap: 17 — ABNORMAL HIGH (ref 5–15)
BUN: 17 mg/dL (ref 6–23)
CALCIUM: 9.5 mg/dL (ref 8.4–10.5)
CHLORIDE: 96 meq/L (ref 96–112)
CO2: 24 meq/L (ref 19–32)
Creatinine, Ser: 0.8 mg/dL (ref 0.50–1.10)
GFR calc Af Amer: >90 mL/min (ref >90)
GFR calc non Af Amer: 86 mL/min — ABNORMAL LOW (ref >90)
GLUCOSE: 375 mg/dL — AB (ref 70–99)
Potassium: 4.3 mEq/L (ref 3.7–5.3)
SODIUM: 137 meq/L (ref 137–147)

## 2013-11-06 LAB — URINE MICROSCOPIC-ADD ON

## 2013-11-06 MED ORDER — SODIUM CHLORIDE 0.9 % IV BOLUS (SEPSIS)
1000.0000 mL | Freq: Once | INTRAVENOUS | Status: AC
  Administered 2013-11-06: 1000 mL via INTRAVENOUS

## 2013-11-06 MED ORDER — INSULIN DETEMIR 100 UNIT/ML ~~LOC~~ SOLN: 40.0000 [IU] | Freq: Two times a day (BID) | SUBCUTANEOUS | Status: DC

## 2013-11-06 NOTE — ED Provider Notes (Signed)
CSN: [id number]     Arrival date & time 11/06/13  7829 History   First MD Initiated Contact with Patient 11/06/13 1017     Chief Complaint  Patient presents with  . Hyperglycemia     (Consider location/radiation/quality/duration/timing/severity/associated sxs/prior Treatment) Patient is a 48 y.o. female presenting with hyperglycemia.  Hyperglycemia Blood sugar level PTA:  500 Severity:  Severe Onset quality:  Gradual Duration: several days. Timing:  Constant Progression:  Worsening Chronicity:  New Diabetes status:  Controlled with insulin Context: noncompliance (Unable to afford Levemir)   Relieved by:  Nothing Ineffective treatments:  Diet Associated symptoms: blurred vision (occasionally), dizziness, malaise, nausea and weakness   Associated symptoms: no abdominal pain, no altered mental status, no fever and no vomiting     Past Medical History  Diagnosis Date  . Diabetes mellitus without complication   . Hypertension   . Iron deficiency anemia, unspecified 07/10/2012  . Type II or unspecified type diabetes mellitus without mention of complication, not stated as uncontrolled 07/10/2012   Past Surgical History  Procedure Laterality Date  . Back surgery    . Cholecystectomy    . Cesarean section     No family history on file. History  Substance Use Topics  . Smoking status: Never Smoker   . Smokeless tobacco: Not on file  . Alcohol Use: Yes     Comment: occasional   OB History   Grav Para Term Preterm Abortions TAB SAB Ect Mult Living                 Review of Systems  Constitutional: Negative for fever.  Eyes: Positive for blurred vision (occasionally).  Gastrointestinal: Positive for nausea. Negative for vomiting and abdominal pain.  Neurological: Positive for dizziness.  All other systems reviewed and are negative.     Allergies  Review of patient's allergies indicates no known allergies.  Home Medications   Prior to Admission medications    Medication Sig Start Date End Date Taking? Authorizing Provider  amLODipine (NORVASC) 5 MG tablet Take 5 mg by mouth daily.   Yes Historical Provider, MD  insulin detemir (LEVEMIR) 100 UNIT/ML injection Inject 40 Units into the skin 2 (two) times daily.   Yes Historical Provider, MD  insulin lispro (HUMALOG) 100 UNIT/ML injection Inject into the skin 3 (three) times daily before meals.   Yes Historical Provider, MD  losartan (COZAAR) 100 MG tablet Take 100 mg by mouth daily.   Yes Historical Provider, MD  Cyanocobalamin (VITAMIN B 12 PO) Take by mouth every morning.    Historical Provider, MD  LISINOPRIL PO Take by mouth every morning.     Historical Provider, MD  METFORMIN HCL PO Take by mouth 2 (two) times daily. 2 tabs twice a day.    Historical Provider, MD   BP 126/60  Pulse 65  Temp(Src) 98.3 F (36.8 C) (Oral)  Resp 18  Ht  (1.549 m)  Wt 170 lb (77.111 kg)  BMI 32.14 kg/m2  SpO2 100% Physical Exam  Nursing note and vitals reviewed. Constitutional: She is oriented to person, place, and time. She appears well-developed and well-nourished. No distress.  HENT:  Head: Normocephalic and atraumatic.  Mouth/Throat: Oropharynx is clear and moist.  Eyes: Conjunctivae are normal. Pupils are equal, round, and reactive to light. No scleral icterus.  Neck: Neck supple.  Cardiovascular: Normal rate, regular rhythm, normal heart sounds and intact distal pulses.   No murmur heard. Pulmonary/Chest: Effort normal and breath sounds normal. No  stridor. No respiratory distress. She has no rales.  Abdominal: Soft. Bowel sounds are normal. She exhibits no distension. There is no tenderness.  Musculoskeletal: Normal range of motion.  Neurological: She is alert and oriented to person, place, and time.  Skin: Skin is warm and dry. No rash noted.  Psychiatric: She has a normal mood and affect. Her behavior is normal.    ED Course  Procedures (including critical care time) Labs Review Labs  Reviewed  CBC WITH DIFFERENTIAL - Abnormal; Notable for the following:    RBC 5.79 (*)    MCV 69.1 (*)    MCH 22.8 (*)    All other components within normal limits  BASIC METABOLIC PANEL - Abnormal; Notable for the following:    Glucose, Bld 375 (*)    GFR calc non Af Amer 86 (*)    Anion gap 17 (*)    All other components within normal limits  URINALYSIS, ROUTINE W REFLEX MICROSCOPIC - Abnormal; Notable for the following:    Specific Gravity, Urine 1.034 (*)    Glucose, UA >1000 (*)    Ketones, ur 40 (*)    Protein, ur 100 (*)    All other components within normal limits  CBG MONITORING, ED - Abnormal; Notable for the following:    Glucose-Capillary 366 (*)    All other components within normal limits  CBG MONITORING, ED - Abnormal; Notable for the following:    Glucose-Capillary 277 (*)    All other components within normal limits  URINE MICROSCOPIC-ADD ON    Imaging Review No results found.   EKG Interpretation   Date/Time:  Wednesday November 06 2013 09:33:43 EDT Ventricular Rate:  74 PR Interval:  136 QRS Duration: 84 QT Interval:  406 QTC Calculation: 450 R Axis:   -4 Text Interpretation:  Normal sinus rhythm Normal ECG No significant change  was found Confirmed by [facility]  MD, TREY ([id number]) on 11/06/2013 11:48:28 AM      MDM   Final diagnoses:  Hyperglycemia    48 yo female with hx of DM presents with hyperglycemia.  Reported to be 500 at home, her BS here was 366.  Well appearing, nontoxic, on initial exam.  Her labs showed glucose of 375, normal bicarb, normal electrolytes.  She is hyperglycemic because she has been unable to afford her levemir.  I discussed her case with case management and with pharmacy.  Pharmacy was able to provide her levemir at a cost that was affordable to the patient.  Otherwise, she was given 2L NS IV which improved her blood sugar.  I expect it to continue to improve as she becomes compliant with her treatment program.  Given return  precautions and advised PCP follow up.      Candyce Churn III, MD 11/06/13 [phone]

## 2013-11-06 NOTE — Discharge Instructions (Signed)

## 2013-11-06 NOTE — ED Notes (Signed)
Patient reports that she has been experiencing dizziness, nausea and blood sugar greater than 300 today. Had tingling in feet last pm and chest heaviness

## 2013-11-06 NOTE — Progress Notes (Signed)
  CARE MANAGEMENT ED NOTE 11/06/2013  Patient:  Michelle Mercer, Michelle Mercer   Account Number:  [id number]  Date Initiated:  11/06/2013  Documentation initiated by:  Ferdinand Cava  Subjective/Objective Assessment:   48 yo female presenting to [facility] with generalized complaints and hyperglycemia     Subjective/Objective Assessment Detail:     Action/Plan:   Patient will get future medication refills at [facility] for increased affirdability   Action/Plan Detail:   Anticipated DC Date:       Status Recommendation to Physician:   Result of Recommendation:  Agreed    DC Planning Services  Medication Assistance    Choice offered to / List presented to:  C-1 Patient          Status of service:  Completed, signed off  ED Comments:   ED Comments Detail:  CM consulted for medications assistance for patient at [facility]. This CM spoke with Brett Canales RN to clarify patient concerns for CM assistance. This CM then spoke with the patient via phone and she stated that she has [employer] through [place] and it has a cap as to how much it covers for medication refills and she has reached her limit. She stated that she follows up with the [facility] for her PCP and they have worked with her in the past but she has not been able to get in touch with them. The patient stated that she gets her medications at [facility] and she is not able to afford her medications when she has to pay out of pocket. This CM informed the patient of the [facility] $4 medications and explained that several of her medications are on the list. Also discussed with the patient the [employer] brand of insulin for $25 per vial. The patient stated that she uses the insulin pen most recently but has used the vials of insulin in the past. This CM recommended that the patient transfer her medication refills to [facility] for affordability. The patient stated that she can afford the medication refills at [facility]. This CM then called and spoke with Janeann Forehand and explained the medication availability and affordability at [facility] if Dr. Loretha Stapler could prescribe off the list the patient stated she can afford the medications. This CM then faxed the [facility] $4 med list and list of insulins to [phone] attn: Brett Canales and received successful transmission.

## 2013-11-06 NOTE — ED Notes (Signed)
Patient given information from case management regarding medications available at discounted prices

## 2013-11-25 ENCOUNTER — Emergency Department (HOSPITAL_BASED_OUTPATIENT_CLINIC_OR_DEPARTMENT_OTHER)
Admission: EM | Admit: 2013-11-25 | Discharge: 2013-11-25 | Disposition: A | Discharge status: Home / Self Care | Payer: No Typology Code available for payment source | Attending: Emergency Medicine | Admitting: Emergency Medicine

## 2013-11-25 ENCOUNTER — Emergency Department (HOSPITAL_BASED_OUTPATIENT_CLINIC_OR_DEPARTMENT_OTHER): Payer: No Typology Code available for payment source

## 2013-11-25 ENCOUNTER — Encounter (HOSPITAL_BASED_OUTPATIENT_CLINIC_OR_DEPARTMENT_OTHER): Payer: Self-pay | Admitting: Emergency Medicine

## 2013-11-25 DIAGNOSIS — Z9089 Acquired absence of other organs: Secondary | ICD-10-CM | POA: Diagnosis not present

## 2013-11-25 DIAGNOSIS — E663 Overweight: Secondary | ICD-10-CM | POA: Diagnosis not present

## 2013-11-25 DIAGNOSIS — R1011 Right upper quadrant pain: Secondary | ICD-10-CM | POA: Insufficient documentation

## 2013-11-25 DIAGNOSIS — R1013 Epigastric pain: Secondary | ICD-10-CM | POA: Insufficient documentation

## 2013-11-25 DIAGNOSIS — R110 Nausea: Secondary | ICD-10-CM | POA: Insufficient documentation

## 2013-11-25 DIAGNOSIS — Z79899 Other long term (current) drug therapy: Secondary | ICD-10-CM | POA: Insufficient documentation

## 2013-11-25 DIAGNOSIS — I10 Essential (primary) hypertension: Secondary | ICD-10-CM | POA: Insufficient documentation

## 2013-11-25 DIAGNOSIS — Z794 Long term (current) use of insulin: Secondary | ICD-10-CM | POA: Diagnosis not present

## 2013-11-25 DIAGNOSIS — D509 Iron deficiency anemia, unspecified: Secondary | ICD-10-CM | POA: Diagnosis not present

## 2013-11-25 DIAGNOSIS — R0789 Other chest pain: Secondary | ICD-10-CM | POA: Diagnosis not present

## 2013-11-25 DIAGNOSIS — E119 Type 2 diabetes mellitus without complications: Secondary | ICD-10-CM | POA: Insufficient documentation

## 2013-11-25 LAB — TROPONIN I: Troponin I: <0.3 ng/mL (ref <0.30)

## 2013-11-25 LAB — COMPREHENSIVE METABOLIC PANEL
ALBUMIN: 3.5 g/dL (ref 3.5–5.2)
ALT: 10 U/L (ref 0–35)
ANION GAP: 17 — AB (ref 5–15)
AST: 16 U/L (ref 0–37)
Alkaline Phosphatase: 160 U/L — ABNORMAL HIGH (ref 39–117)
BUN: 17 mg/dL (ref 6–23)
CALCIUM: 9.4 mg/dL (ref 8.4–10.5)
CO2: 25 mEq/L (ref 19–32)
CREATININE: 0.7 mg/dL (ref 0.50–1.10)
Chloride: 91 mEq/L — ABNORMAL LOW (ref 96–112)
GFR calc Af Amer: >90 mL/min (ref >90)
Glucose, Bld: 514 mg/dL — ABNORMAL HIGH (ref 70–99)
Potassium: 4.1 mEq/L (ref 3.7–5.3)
Sodium: 133 mEq/L — ABNORMAL LOW (ref 137–147)
Total Protein: 8.1 g/dL (ref 6.0–8.3)

## 2013-11-25 LAB — LIPASE, BLOOD: Lipase: 52 U/L (ref 11–59)

## 2013-11-25 LAB — CBC
HEMATOCRIT: 36.7 % (ref 36.0–46.0)
Hemoglobin: 12.1 g/dL (ref 12.0–15.0)
MCH: 22.7 pg — ABNORMAL LOW (ref 26.0–34.0)
MCHC: 33 g/dL (ref 30.0–36.0)
MCV: 68.9 fL — AB (ref 78.0–100.0)
Platelets: 334 10*3/uL (ref 150–400)
RBC: 5.33 MIL/uL — ABNORMAL HIGH (ref 3.87–5.11)
RDW: 14.9 % (ref 11.5–15.5)
WBC: 8.4 10*3/uL (ref 4.0–10.5)

## 2013-11-25 MED ORDER — GI COCKTAIL ~~LOC~~
30.0000 mL | Freq: Once | ORAL | Status: AC
  Administered 2013-11-25: 30 mL via ORAL
  Filled 2013-11-25: qty 30

## 2013-11-25 MED ORDER — FAMOTIDINE 20 MG PO TABS: 20.0000 mg | ORAL_TABLET | Freq: Two times a day (BID) | ORAL | Status: DC

## 2013-11-25 MED ORDER — ONDANSETRON 4 MG PO TBDP: ORAL_TABLET | ORAL | Status: DC

## 2013-11-25 MED ORDER — ONDANSETRON 4 MG PO TBDP
4.0000 mg | ORAL_TABLET | Freq: Once | ORAL | Status: AC
  Administered 2013-11-25: 4 mg via ORAL
  Filled 2013-11-25: qty 1

## 2013-11-25 NOTE — ED Provider Notes (Signed)
CSN: [id number]     Arrival date & time 11/25/13  1139 History   First MD Initiated Contact with Patient 11/25/13 1153     Chief Complaint  Patient presents with  . Abdominal Pain     (Consider location/radiation/quality/duration/timing/severity/associated sxs/prior Treatment) HPI Comments: This is a 48 year old female with a history of diabetes and HTN who presents to the ED complaining of one week of achy epigastric pain. Pain is worse when eating and nonradiating. Any fluid or food bothers her mid-epigastric area. She became nauseated when arriving to ED, no prior nausea. She has tried Tums and GasX for the pain but has had no relief. She has not had any changes in bowel movements and denies constipation and diarrhea. She also reports chest wall discomfort on the right side that she attributes to sleeping on her right side. Denies chest pain, vomiting, fever or chills. Past abdominal surgeries include laporoscopic cholecystectomy and cesarean section.   Patient is a 48 y.o. female presenting with abdominal pain. The history is provided by the patient.  Abdominal Pain Associated symptoms: nausea     Past Medical History  Diagnosis Date  . Diabetes mellitus without complication   . Hypertension   . Iron deficiency anemia, unspecified 07/10/2012  . Type II or unspecified type diabetes mellitus without mention of complication, not stated as uncontrolled 07/10/2012   Past Surgical History  Procedure Laterality Date  . Back surgery    . Cholecystectomy    . Cesarean section     No family history on file. History  Substance Use Topics  . Smoking status: Never Smoker   . Smokeless tobacco: Not on file  . Alcohol Use: Yes     Comment: occasional   OB History   Grav Para Term Preterm Abortions TAB SAB Ect Mult Living                 Review of Systems  Gastrointestinal: Positive for nausea and abdominal pain.  Musculoskeletal:       +R chest wall pain.  All other systems  reviewed and are negative.     Allergies  Review of patient's allergies indicates no known allergies.  Home Medications   Prior to Admission medications   Medication Sig Start Date End Date Taking? Authorizing Provider  amLODipine (NORVASC) 5 MG tablet Take 5 mg by mouth daily.    Historical Provider, MD  Cyanocobalamin (VITAMIN B 12 PO) Take by mouth every morning.    Historical Provider, MD  famotidine (PEPCID) 20 MG tablet Take 1 tablet (20 mg total) by mouth 2 (two) times daily. 11/25/13   Jahred Tatar M Kinte Trim, PA-C  insulin detemir (LEVEMIR) 100 UNIT/ML injection Inject 0.4 mLs (40 Units total) into the skin 2 (two) times daily. 11/06/13   Warnell Foresterrey Wofford, MD  insulin lispro (HUMALOG) 100 UNIT/ML injection Inject into the skin 3 (three) times daily before meals.    Historical Provider, MD  LISINOPRIL PO Take by mouth every morning.     Historical Provider, MD  losartan (COZAAR) 100 MG tablet Take 100 mg by mouth daily.    Historical Provider, MD  METFORMIN HCL PO Take by mouth 2 (two) times daily. 2 tabs twice a day.    Historical Provider, MD  ondansetron (ZOFRAN ODT) 4 MG disintegrating tablet 4mg  ODT q4 hours prn nausea/vomit 11/25/13   Antoney Biven M Trayden Brandy, PA-C   BP 156/79  Pulse 54  Temp(Src) 98 F (36.7 C) (Oral)  Resp 18  Ht 5'  1" (1.549 m)  Wt 175 lb (79.379 kg)  BMI 33.08 kg/m2  SpO2 98% Physical Exam  Nursing note and vitals reviewed. Constitutional: She is oriented to person, place, and time. She appears well-developed and well-nourished. No distress.  Overweight.  HENT:  Head: Normocephalic and atraumatic.  Mouth/Throat: Oropharynx is clear and moist.  Eyes: Conjunctivae and EOM are normal. Pupils are equal, round, and reactive to light.  Neck: Normal range of motion. Neck supple. No JVD present.  Cardiovascular: Normal rate, regular rhythm, normal heart sounds and intact distal pulses.   No extremity edema.  Pulmonary/Chest: Effort normal and breath sounds normal. No  respiratory distress.    Abdominal: Soft. Normal appearance and bowel sounds are normal. She exhibits no distension. There is tenderness in the right upper quadrant and epigastric area. There is no rigidity, no rebound and no guarding.  No peritoneal signs.  Musculoskeletal: Normal range of motion. She exhibits no edema.  Neurological: She is alert and oriented to person, place, and time. She has normal strength. No sensory deficit.  Speech fluent, goal oriented. Moves limbs without ataxia. Equal grip strength bilateral.  Skin: Skin is warm and dry. She is not diaphoretic.  Psychiatric: She has a normal mood and affect. Her behavior is normal.    ED Course  Procedures (including critical care time) Labs Review Labs Reviewed  CBC - Abnormal; Notable for the following:    RBC 5.33 (*)    MCV 68.9 (*)    MCH 22.7 (*)    All other components within normal limits  COMPREHENSIVE METABOLIC PANEL - Abnormal; Notable for the following:    Sodium 133 (*)    Chloride 91 (*)    Glucose, Bld 514 (*)    Alkaline Phosphatase 160 (*)    Total Bilirubin <0.2 (*)    Anion gap 17 (*)    All other components within normal limits  LIPASE, BLOOD  TROPONIN I    Imaging Review US Abdomen Complete  11/25/2013   CLINICAL DATA:  Right upper quadrant and epigastric pain x1 week.  EXAM: ULTRASOUND ABDOMEN COMPLETE  COMPARISON:  None.  FINDINGS: Gallbladder: Cholecystectomy.  Common bile duct: Diameter: 2.5 mm  Liver: Mild increase echogenicity diffusely. This is consistent fatty infiltration.  IVC: No abnormality visualized.  Pancreas: Visualized portion unremarkable.  Spleen: Size and appearance within normal limits.  Right Kidney: Length: 11.4 cm. Echogenicity within normal limits. No mass or hydronephrosis visualized.  Left Kidney: Length: 10.4 cm. Echogenicity within normal limits. No mass or hydronephrosis visualized.  Abdominal aorta: No aneurysm visualized.  Other findings: None.  IMPRESSION: 1.  Cholecystectomy.  No biliary distention. 2. Diffuse fatty infiltration of the liver cannot be excluded.   Electronically Signed   By: Maisie Fus  Register   On: 11/25/2013 14:05     EKG Interpretation   Date/Time:  Monday November 25 2013 11:45:54 EDT Ventricular Rate:  71 PR Interval:  154 QRS Duration: 92 QT Interval:  392 QTC Calculation: 425 R Axis:   2 Text Interpretation:  Normal sinus rhythm Normal ECG No significant change  since last tracing Confirmed by DOCHERTY  MD, MEGAN ([id number]) on 11/25/2013  11:51:30 AM      MDM   Final diagnoses:  Epigastric abdominal pain   Patient nontoxic appearing and in no apparent distress. Pain exacerbated by eating or drinking anything. History of cholecystectomy, however elevated alkaline phosphatase higher than past results noted. Given tenderness in right upper quadrant and midepigastric area, will obtain abdominal  ultrasound to evaluate for retained stone. Doubt cardiac, she is not having any chest pain, just reproducible right-sided chest tenderness to palpation. HEART score 3.  US showing no biliary distension. Diffuse fatty infiltration of liver cannot be excluded. Pt reports great improvement with GI cocktail and zofran. On repeat exam, abdomen remains soft, still with tenderness, however improved from initial exam. Pt stable for d/c. Will d/c with zofran and pepcid. F/u with PCP. Discussed symptoms of possible GERD. Return precautions given. Patient states understanding of treatment care plan and is agreeable.  Kathrynn SpeedRobyn M Kylyn Sookram, PA-C 11/25/13 1507

## 2013-11-25 NOTE — ED Provider Notes (Signed)
Medical screening examination/treatment/procedure(s) were performed by non-physician practitioner and as supervising physician I was immediately available for consultation/collaboration.   Aracelly Tencza, MD 11/25/13 1531 

## 2013-11-25 NOTE — ED Notes (Signed)
Reports epigastric pain x 1 week. Sts pain is worse after eating. Reports some SHOB. Denies n/v.

## 2013-11-25 NOTE — ED Notes (Signed)
PA at bedside.

## 2013-11-25 NOTE — Discharge Instructions (Signed)
Take zofran as directed for nausea. Take pepcid as prescribed. Follow up with your primary care doctor.  Abdominal Pain Many things can cause abdominal pain. Usually, abdominal pain is not caused by a disease and will improve without treatment. It can often be observed and treated at home. Your health care provider will do a physical exam and possibly order blood tests and X-rays to help determine the seriousness of your pain. However, in many cases, more time must pass before a clear cause of the pain can be found. Before that point, your health care provider may not know if you need more testing or further treatment. HOME CARE INSTRUCTIONS  Monitor your abdominal pain for any changes. The following actions may help to alleviate any discomfort you are experiencing:  Only take over-the-counter or prescription medicines as directed by your health care provider.  Do not take laxatives unless directed to do so by your health care provider.  Try a clear liquid diet (broth, tea, or water) as directed by your health care provider. Slowly move to a bland diet as tolerated. SEEK MEDICAL CARE IF:  You have unexplained abdominal pain.  You have abdominal pain associated with nausea or diarrhea.  You have pain when you urinate or have a bowel movement.  You experience abdominal pain that wakes you in the night.  You have abdominal pain that is worsened or improved by eating food.  You have abdominal pain that is worsened with eating fatty foods.  You have a fever. SEEK IMMEDIATE MEDICAL CARE IF:   Your pain does not go away within 2 hours.  You keep throwing up (vomiting).  Your pain is felt only in portions of the abdomen, such as the right side or the left lower portion of the abdomen.  You pass bloody or black tarry stools. MAKE SURE YOU:  Understand these instructions.   Will watch your condition.   Will get help right away if you are not doing well or get worse.  Document  Released: 11/10/2004 Document Revised: 02/05/2013 Document Reviewed: 10/10/2012 [facility] Patient Information 2015 [place], [place]. This information is not intended to replace advice given to you by your health care provider. Make sure you discuss any questions you have with your health care provider. Food Choices for Gastroesophageal Reflux Disease When you have gastroesophageal reflux disease (GERD), the foods you eat and your eating habits are very important. Choosing the right foods can help ease the discomfort of GERD. WHAT GENERAL GUIDELINES DO I NEED TO FOLLOW?  Choose fruits, vegetables, whole grains, low-fat dairy products, and low-fat meat, fish, and poultry.  Limit fats such as oils, salad dressings, butter, nuts, and avocado.  Keep a food diary to identify foods that cause symptoms.  Avoid foods that cause reflux. These may be different for different people.  Eat frequent small meals instead of three large meals each day.  Eat your meals slowly, in a relaxed setting.  Limit fried foods.  Cook foods using methods other than frying.  Avoid drinking alcohol.  Avoid drinking large amounts of liquids with your meals.  Avoid bending over or lying down until 2-3 hours after eating. WHAT FOODS ARE NOT RECOMMENDED? The following are some foods and drinks that may worsen your symptoms: Vegetables Tomatoes. Tomato juice. Tomato and spaghetti sauce. Chili peppers. Onion and garlic. Horseradish. Fruits Oranges, grapefruit, and lemon (fruit and juice). Meats High-fat meats, fish, and poultry. This includes hot dogs, ribs, ham, sausage, salami, and bacon. Dairy Whole milk and  chocolate milk. Sour cream. Cream. Butter. Ice cream. Cream cheese.  Beverages Coffee and tea, with or without caffeine. Carbonated beverages or energy drinks. Condiments Hot sauce. Barbecue sauce.  Sweets/Desserts Chocolate and cocoa. Donuts. Peppermint and spearmint. Fats and Oils High-fat foods,  including [place] fries and potato chips. Other Vinegar. Strong spices, such as black pepper, white pepper, red pepper, cayenne, curry powder, cloves, ginger, and chili powder. The items listed above may not be a complete list of foods and beverages to avoid. Contact your dietitian for more information. Document Released: 01/31/2005 Document Revised: 02/05/2013 Document Reviewed: 12/05/2012 [facility] Patient Information 2015 [place], [place]. This information is not intended to replace advice given to you by your health care provider. Make sure you discuss any questions you have with your health care provider.

## 2014-03-25 ENCOUNTER — Other Ambulatory Visit: Payer: Self-pay | Admitting: Family

## 2015-02-19 ENCOUNTER — Emergency Department (HOSPITAL_BASED_OUTPATIENT_CLINIC_OR_DEPARTMENT_OTHER)
Admission: EM | Admit: 2015-02-19 | Discharge: 2015-02-20 | Disposition: A | Discharge status: Home / Self Care | Payer: BLUE CROSS/BLUE SHIELD | Attending: Emergency Medicine | Admitting: Emergency Medicine

## 2015-02-19 ENCOUNTER — Encounter (HOSPITAL_BASED_OUTPATIENT_CLINIC_OR_DEPARTMENT_OTHER): Payer: Self-pay | Admitting: *Deleted

## 2015-02-19 DIAGNOSIS — Z862 Personal history of diseases of the blood and blood-forming organs and certain disorders involving the immune mechanism: Secondary | ICD-10-CM | POA: Diagnosis not present

## 2015-02-19 DIAGNOSIS — Z794 Long term (current) use of insulin: Secondary | ICD-10-CM | POA: Diagnosis not present

## 2015-02-19 DIAGNOSIS — H538 Other visual disturbances: Secondary | ICD-10-CM | POA: Insufficient documentation

## 2015-02-19 DIAGNOSIS — I10 Essential (primary) hypertension: Secondary | ICD-10-CM | POA: Insufficient documentation

## 2015-02-19 DIAGNOSIS — R51 Headache: Secondary | ICD-10-CM | POA: Diagnosis present

## 2015-02-19 DIAGNOSIS — Z79899 Other long term (current) drug therapy: Secondary | ICD-10-CM | POA: Diagnosis not present

## 2015-02-19 DIAGNOSIS — G44209 Tension-type headache, unspecified, not intractable: Secondary | ICD-10-CM | POA: Diagnosis not present

## 2015-02-19 MED ORDER — NAPROXEN 500 MG PO TABS: 500.0000 mg | ORAL_TABLET | Freq: Two times a day (BID) | ORAL | Status: DC | PRN

## 2015-02-19 MED ORDER — ONDANSETRON 8 MG PO TBDP: 8.0000 mg | ORAL_TABLET | Freq: Three times a day (TID) | ORAL | Status: DC | PRN

## 2015-02-19 MED ORDER — NAPROXEN 250 MG PO TABS
500.0000 mg | ORAL_TABLET | Freq: Once | ORAL | Status: AC
  Administered 2015-02-19: 500 mg via ORAL
  Filled 2015-02-19: qty 2

## 2015-02-19 NOTE — ED Provider Notes (Signed)
CSN: [id number]     Arrival date & time 02/19/15  2047 History  By signing my name below, I, Michelle Mercer, attest that this documentation has been prepared under the direction and in the presence of Paula LibraJohn Lanier Millon, MD. Electronically Signed: Jarvis Morganaylor Mercer, ED Scribe. 02/19/2015. 11:29 PM.    Chief Complaint  Patient presents with  . Headache   The history is provided by the patient. No language interpreter was used.    HPI Comments: Michelle Mercer is a 50 y.o. female with a h/o type II DM and HTN who presents to the Emergency Department complaining of a moderate to severe, pressure-like, occipital HA that radiates into the left side of her face. This headache began about 9 AM and its onset was gradual. She has been getting similar headaches for over a month but do not happen every day. This one is the worst she has had. Pain usually resolves with Tylenol but not today. Since being in the emergency department her pain is significantly improved. She rated her pain as a 7 out of 10 on arrival. Pt has not taken any other medications prior to arrival. She denies any other modifying factors. Pt notes she has blurred vision at baseline due to glaucoma for which she uses eye drops. She denies any photophobia, new blurred vision, numbness, weakness or other associated symptoms.   Past Medical History  Diagnosis Date  . Diabetes mellitus without complication (HCC)   . Hypertension   . Iron deficiency anemia, unspecified 07/10/2012  . Type II or unspecified type diabetes mellitus without mention of complication, not stated as uncontrolled 07/10/2012   Past Surgical History  Procedure Laterality Date  . Back surgery    . Cholecystectomy    . Cesarean section    . Tubal ligation     History reviewed. No pertinent family history. Social History  Substance Use Topics  . Smoking status: Never Smoker   . Smokeless tobacco: None  . Alcohol Use: Yes     Comment: occasional   OB History    No data  available     Review of Systems A complete 10 system review of systems was obtained and all systems are negative except as noted in the HPI and PMH.   Allergies  Review of patient's allergies indicates no known allergies.  Home Medications   Prior to Admission medications   Medication Sig Start Date End Date Taking? Authorizing Provider  amLODipine (NORVASC) 5 MG tablet Take 5 mg by mouth daily.    Historical Provider, MD  Cyanocobalamin (VITAMIN B 12 PO) Take by mouth every morning.    Historical Provider, MD  famotidine (PEPCID) 20 MG tablet Take 1 tablet (20 mg total) by mouth 2 (two) times daily. 11/25/13   Robyn M Hess, PA-C  insulin detemir (LEVEMIR) 100 UNIT/ML injection Inject 0.4 mLs (40 Units total) into the skin 2 (two) times daily. 11/06/13   Blake DivineJohn Wofford, MD  insulin lispro (HUMALOG) 100 UNIT/ML injection Inject into the skin 3 (three) times daily before meals.    Historical Provider, MD  LISINOPRIL PO Take by mouth every morning.     Historical Provider, MD  losartan (COZAAR) 100 MG tablet Take 100 mg by mouth daily.    Historical Provider, MD  naproxen (NAPROSYN) 500 MG tablet Take 1 tablet (500 mg total) by mouth 2 (two) times daily as needed for headache. 02/19/15   Paula LibraJohn Shenice Dolder, MD  ondansetron (ZOFRAN-ODT) 8 MG disintegrating tablet Take 1 tablet (8 mg  total) by mouth every 8 (eight) hours as needed for nausea or vomiting. 02/19/15   Kort Stettler, MD   Triage Vitals: BP 168/79 mmHg  Pulse 100  Temp(Src) 98.2 F (36.8 C)  Resp 18  Ht 5\' 1"  (1.549 m)  Wt 205 lb (92.987 kg)  BMI 38.75 kg/m2  SpO2 100%  Physical Exam  Nursing note and vitals reviewed. General: Well-developed, well-nourished female in no acute distress; appearance consistent with age of record HENT: normocephalic; atraumatic; muscle tenderness at base of skull Eyes: pupils equal, round and reactive to light; extraocular muscles intact Neck: supple Heart: regular rate and rhythm Lungs: clear to  auscultation bilaterally Abdomen: soft; nondistended Extremities: No deformity; full range of motion; pulses normal Neurologic: Awake, alert and oriented; motor function intact in all extremities and symmetric; no facial droop; negative Romberg; normal finger to nose; normal coordination and speech Skin: Warm and dry Psychiatric: Normal mood and affect   ED Course  Procedures (including critical care time)   MDM  History and exam are consistent with tension headaches. She may also have some contribution from her recently diagnosed glaucoma. I do not see any neurologic deficits on exam that would prompt a CT scan. She was advised that should her symptoms persist or worsen she should follow-up with her PCP may advise a CT scan at that time.  Final diagnoses:  Tension headache   I personally performed the services described in this documentation, which was scribed in my presence. The recorded information has been reviewed and is accurate.     Paula Libra, MD 02/19/15 2330

## 2015-02-19 NOTE — Discharge Instructions (Signed)
Tension Headache A tension headache is a feeling of pain, pressure, or aching that is often felt over the front and sides of the head. The pain can be dull, or it can feel tight (constricting). Tension headaches are not normally associated with nausea or vomiting, and they do not get worse with physical activity. Tension headaches can last from 30 minutes to several days. This is the most common type of headache. CAUSES The exact cause of this condition is not known. Tension headaches often begin after stress, anxiety, or depression. Other triggers may include:  Alcohol.  Too much caffeine, or caffeine withdrawal.  Respiratory infections, such as colds, flu, or sinus infections.  Dental problems or teeth clenching.  Fatigue.  Holding your head and neck in the same position for a long period of time, such as while using a computer.  Smoking. SYMPTOMS Symptoms of this condition include:  A feeling of pressure around the head.  Dull, aching head pain.  Pain felt over the front and sides of the head.  Tenderness in the muscles of the head, neck, and shoulders. DIAGNOSIS This condition may be diagnosed based on your symptoms and a physical exam. Tests may be done, such as a CT scan or an MRI of your head. These tests may be done if your symptoms are severe or unusual. TREATMENT This condition may be treated with lifestyle changes and medicines to help relieve symptoms. HOME CARE INSTRUCTIONS Managing Pain  Take over-the-counter and prescription medicines only as told by your health care provider.  Lie down in a dark, quiet room when you have a headache.  If directed, apply ice to the head and neck area:  Put ice in a plastic bag.  Place a towel between your skin and the bag.  Leave the ice on for 20 minutes, 2-3 times per day.  Use a heating pad or a hot shower to apply heat to the head and neck area as told by your health care provider. Eating and Drinking  Eat meals on  a regular schedule.  Limit alcohol use.  Decrease your caffeine intake, or stop using caffeine. General Instructions  Keep all follow-up visits as told by your health care provider. This is important.  Keep a headache journal to help find out what may trigger your headaches. For example, write down:  What you eat and drink.  How much sleep you get.  Any change to your diet or medicines.  Try massage or other relaxation techniques.  Limit stress.  Sit up straight, and avoid tensing your muscles.  Do not use tobacco products, including cigarettes, chewing tobacco, or e-cigarettes. If you need help quitting, ask your health care provider.  Exercise regularly as told by your health care provider.  Get 7-9 hours of sleep, or the amount recommended by your health care provider. SEEK MEDICAL CARE IF:  Your symptoms are not helped by medicine.  You have a headache that is different from what you normally experience.  You have nausea or you vomit.  You have a fever. SEEK IMMEDIATE MEDICAL CARE IF:  Your headache becomes severe.  You have repeated vomiting.  You have a stiff neck.  You have a loss of vision.  You have problems with speech.  You have pain in your eye or ear.  You have muscular weakness or loss of muscle control.  You lose your balance or you have trouble walking.  You feel faint or you pass out.  You have confusion.     This information is not intended to replace advice given to you by your health care provider. Make sure you discuss any questions you have with your health care provider.   Document Released: 01/31/2005 Document Revised: 10/22/2014 Document Reviewed: 05/26/2014 Elsevier Interactive Patient Education 2016 [employer].  

## 2015-02-19 NOTE — ED Notes (Addendum)
Pt c/o headache " off and on"  x 1  Month with nausea Pt states she may have developed anxiety

## 2015-11-23 ENCOUNTER — Encounter (HOSPITAL_BASED_OUTPATIENT_CLINIC_OR_DEPARTMENT_OTHER): Payer: Self-pay | Admitting: Emergency Medicine

## 2015-11-23 ENCOUNTER — Emergency Department (HOSPITAL_BASED_OUTPATIENT_CLINIC_OR_DEPARTMENT_OTHER): Payer: BLUE CROSS/BLUE SHIELD

## 2015-11-23 ENCOUNTER — Emergency Department (HOSPITAL_BASED_OUTPATIENT_CLINIC_OR_DEPARTMENT_OTHER)
Admission: EM | Admit: 2015-11-23 | Discharge: 2015-11-23 | Disposition: A | Discharge status: Home / Self Care | Payer: BLUE CROSS/BLUE SHIELD | Attending: Emergency Medicine | Admitting: Emergency Medicine

## 2015-11-23 DIAGNOSIS — E119 Type 2 diabetes mellitus without complications: Secondary | ICD-10-CM | POA: Insufficient documentation

## 2015-11-23 DIAGNOSIS — R079 Chest pain, unspecified: Secondary | ICD-10-CM | POA: Diagnosis present

## 2015-11-23 DIAGNOSIS — I10 Essential (primary) hypertension: Secondary | ICD-10-CM | POA: Diagnosis not present

## 2015-11-23 DIAGNOSIS — Z794 Long term (current) use of insulin: Secondary | ICD-10-CM | POA: Diagnosis not present

## 2015-11-23 DIAGNOSIS — R1013 Epigastric pain: Secondary | ICD-10-CM

## 2015-11-23 DIAGNOSIS — Z79899 Other long term (current) drug therapy: Secondary | ICD-10-CM | POA: Diagnosis not present

## 2015-11-23 DIAGNOSIS — E876 Hypokalemia: Secondary | ICD-10-CM

## 2015-11-23 LAB — BASIC METABOLIC PANEL
ANION GAP: 12 (ref 5–15)
BUN: 18 mg/dL (ref 6–20)
CO2: 29 mmol/L (ref 22–32)
Calcium: 8.8 mg/dL — ABNORMAL LOW (ref 8.9–10.3)
Chloride: 95 mmol/L — ABNORMAL LOW (ref 101–111)
Creatinine, Ser: 1.13 mg/dL — ABNORMAL HIGH (ref 0.44–1.00)
GFR calc Af Amer: >60 mL/min (ref >60)
GFR calc non Af Amer: 56 mL/min — ABNORMAL LOW (ref >60)
GLUCOSE: 296 mg/dL — AB (ref 65–99)
POTASSIUM: 3 mmol/L — AB (ref 3.5–5.1)
Sodium: 136 mmol/L (ref 135–145)

## 2015-11-23 LAB — CBC
HEMATOCRIT: 34.3 % — AB (ref 36.0–46.0)
HEMOGLOBIN: 10.8 g/dL — AB (ref 12.0–15.0)
MCH: 21.6 pg — ABNORMAL LOW (ref 26.0–34.0)
MCHC: 31.5 g/dL (ref 30.0–36.0)
MCV: 68.5 fL — ABNORMAL LOW (ref 78.0–100.0)
Platelets: 447 10*3/uL — ABNORMAL HIGH (ref 150–400)
RBC: 5.01 MIL/uL (ref 3.87–5.11)
RDW: 16 % — ABNORMAL HIGH (ref 11.5–15.5)
WBC: 11.1 10*3/uL — AB (ref 4.0–10.5)

## 2015-11-23 LAB — DIFFERENTIAL
BASOS ABS: 0.1 10*3/uL (ref 0.0–0.1)
Basophils Relative: 1 %
EOS ABS: 0.3 10*3/uL (ref 0.0–0.7)
EOS PCT: 3 %
Lymphocytes Relative: 25 %
Lymphs Abs: 2.8 10*3/uL (ref 0.7–4.0)
MONO ABS: 1 10*3/uL (ref 0.1–1.0)
MONOS PCT: 9 %
NEUTROS PCT: 62 %
Neutro Abs: 6.9 10*3/uL (ref 1.7–7.7)

## 2015-11-23 LAB — HEPATIC FUNCTION PANEL
ALBUMIN: 3.8 g/dL (ref 3.5–5.0)
ALT: 18 U/L (ref 14–54)
AST: 18 U/L (ref 15–41)
Alkaline Phosphatase: 85 U/L (ref 38–126)
Bilirubin, Direct: <0.1 mg/dL — ABNORMAL LOW (ref 0.1–0.5)
Total Bilirubin: 0.2 mg/dL — ABNORMAL LOW (ref 0.3–1.2)
Total Protein: 7.6 g/dL (ref 6.5–8.1)

## 2015-11-23 LAB — TROPONIN I: Troponin I: <0.03 ng/mL (ref <0.03)

## 2015-11-23 LAB — LIPASE, BLOOD: Lipase: 35 U/L (ref 11–51)

## 2015-11-23 MED ORDER — PANTOPRAZOLE SODIUM 40 MG PO TBEC
40.0000 mg | DELAYED_RELEASE_TABLET | Freq: Once | ORAL | Status: AC
  Administered 2015-11-23: 40 mg via ORAL
  Filled 2015-11-23: qty 1

## 2015-11-23 MED ORDER — ASPIRIN 81 MG PO CHEW
324.0000 mg | CHEWABLE_TABLET | Freq: Once | ORAL | Status: AC
  Administered 2015-11-23: 324 mg via ORAL
  Filled 2015-11-23: qty 4

## 2015-11-23 MED ORDER — PANTOPRAZOLE SODIUM 40 MG PO TBEC: 40.0000 mg | DELAYED_RELEASE_TABLET | Freq: Every day | ORAL | 0 refills | Status: DC

## 2015-11-23 MED ORDER — POTASSIUM CHLORIDE CRYS ER 20 MEQ PO TBCR
40.0000 meq | EXTENDED_RELEASE_TABLET | Freq: Once | ORAL | Status: AC
  Administered 2015-11-23: 40 meq via ORAL
  Filled 2015-11-23: qty 2

## 2015-11-23 MED ORDER — POTASSIUM CHLORIDE CRYS ER 20 MEQ PO TBCR: 20.0000 meq | EXTENDED_RELEASE_TABLET | Freq: Two times a day (BID) | ORAL | 0 refills | Status: DC

## 2015-11-23 MED ORDER — GI COCKTAIL ~~LOC~~
30.0000 mL | Freq: Once | ORAL | Status: AC
  Administered 2015-11-23: 30 mL via ORAL
  Filled 2015-11-23: qty 30

## 2015-11-23 NOTE — ED Triage Notes (Signed)
Patient states that she has has a "pressure" to her chest for the last month. Usually it clears with alka seltzer but tonight it has not

## 2015-11-23 NOTE — ED Provider Notes (Addendum)
[facility]   CSN: [id number] Arrival date & time: 11/23/15  0324     History   Chief Complaint Chief Complaint  Patient presents with  . Chest Pain    HPI Michelle Gunneramela Mercer is a 50 y.o. female.  She has history of diabetes and hypertension as well as borderline hyperlipidemia. For the last month, she has been having epigastric discomfort which she describes a pressure feeling. She treats this with Alka-Seltzer and generally has been getting relief. Tonight, she had an episode which did not get better with Alka-Seltzer. Episodes have been associated with them dyspnea, nausea, vomiting, diaphoresis. Other than the Alka-Seltzer, nothing seems to make the pain better or worse and nothing seems to precipitate attacks. She is a nonsmoker and there is no known family history of premature coronary atherosclerosis.  The history is provided by the patient.  Chest Pain      Past Medical History:  Diagnosis Date  . Diabetes mellitus without complication (HCC)   . Hypertension   . Iron deficiency anemia, unspecified 07/10/2012  . Type II or unspecified type diabetes mellitus without mention of complication, not stated as uncontrolled 07/10/2012    Patient Active Problem List   Diagnosis Date Noted  . Iron deficiency anemia, unspecified 07/10/2012  . Type II or unspecified type diabetes mellitus without mention of complication, not stated as uncontrolled 07/10/2012    Past Surgical History:  Procedure Laterality Date  . BACK SURGERY    . CESAREAN SECTION    . CHOLECYSTECTOMY    . TUBAL LIGATION      OB History    No data available       Home Medications    Prior to Admission medications   Medication Sig Start Date End Date Taking? Authorizing Provider  amLODipine (NORVASC) 5 MG tablet Take 5 mg by mouth daily.    Historical Provider, MD  Cyanocobalamin (VITAMIN B 12 PO) Take by mouth every morning.    Historical Provider, MD  famotidine (PEPCID) 20  MG tablet Take 1 tablet (20 mg total) by mouth 2 (two) times daily. 11/25/13   Robyn M Hess, PA-C  insulin detemir (LEVEMIR) 100 UNIT/ML injection Inject 0.4 mLs (40 Units total) into the skin 2 (two) times daily. 11/06/13   Blake DivineJohn Wofford, MD  insulin lispro (HUMALOG) 100 UNIT/ML injection Inject into the skin 3 (three) times daily before meals.    Historical Provider, MD  LISINOPRIL PO Take by mouth every morning.     Historical Provider, MD  losartan (COZAAR) 100 MG tablet Take 100 mg by mouth daily.    Historical Provider, MD  naproxen (NAPROSYN) 500 MG tablet Take 1 tablet (500 mg total) by mouth 2 (two) times daily as needed for headache. 02/19/15   Paula LibraJohn Molpus, MD  ondansetron (ZOFRAN-ODT) 8 MG disintegrating tablet Take 1 tablet (8 mg total) by mouth every 8 (eight) hours as needed for nausea or vomiting. 02/19/15   Paula LibraJohn Molpus, MD    Family History History reviewed. No pertinent family history.  Social History Social History  Substance Use Topics  . Smoking status: Never Smoker  . Smokeless tobacco: Never Used  . Alcohol use Yes     Comment: occasional     Allergies   Review of patient's allergies indicates no known allergies.   Review of Systems Review of Systems  Cardiovascular: Positive for chest pain.  All other systems reviewed and are negative.    Physical Exam Updated Vital Signs BP 148/69 (  BP Location: Right Arm)   Pulse 87   Temp 98.3 F (36.8 C) (Oral)   Resp (!) 85   Ht 5\' 1"  (1.549 m)   Wt 207 lb (93.9 kg)   SpO2 100%   BMI 39.11 kg/m   Physical Exam  Nursing note and vitals reviewed.  50 year old female, resting comfortably and in no acute distress. Vital signs are significant for mild hypertension. Oxygen saturation is normal %, which is normal. Head is normocephalic and atraumatic. PERRLA, EOMI. Oropharynx is clear. Neck is nontender and supple without adenopathy or JVD. Back is nontender and there is no CVA tenderness. Lungs are clear without  rales, wheezes, or rhonchi. Chest is nontender. Heart has regular rate and rhythm without murmur. Abdomen is soft, flat, with mild epigastric tenderness. There is no rebound or guarding. There are no masses or hepatosplenomegaly and peristalsis is normoactive. Extremities have no cyanosis or edema, full range of motion is present. Skin is warm and dry without rash. Neurologic: Mental status is normal, cranial nerves are intact, there are no motor or sensory deficits.   ED Treatments / Results  Labs (all labs ordered are listed, but only abnormal results are displayed) Labs Reviewed  BASIC METABOLIC PANEL - Abnormal; Notable for the following:       Result Value   Potassium 3.0 (*)    Chloride 95 (*)    Glucose, Bld 296 (*)    Creatinine, Ser 1.13 (*)    Calcium 8.8 (*)    GFR calc non Af Amer 56 (*)    All other components within normal limits  CBC - Abnormal; Notable for the following:    WBC 11.1 (*)    Hemoglobin 10.8 (*)    HCT 34.3 (*)    MCV 68.5 (*)    MCH 21.6 (*)    RDW 16.0 (*)    Platelets 447 (*)    All other components within normal limits  HEPATIC FUNCTION PANEL - Abnormal; Notable for the following:    Total Bilirubin 0.2 (*)    Bilirubin, Direct <0.1 (*)    All other components within normal limits  TROPONIN I  LIPASE, BLOOD  DIFFERENTIAL    EKG  EKG Interpretation  Date/Time:  Monday November 23 2015 03:32:53 EDT Ventricular Rate:  87 PR Interval:    QRS Duration: 105 QT Interval:  405 QTC Calculation: 488 R Axis:   91 Text Interpretation:  Sinus rhythm Borderline right axis deviation Low voltage, precordial leads Probable anteroseptal infarct, old Borderline T abnormalities, inferior leads When compared with ECG of 11/25/2013, No significant change was found Confirmed by [facility]  MD, Corrin Sieling ([id number]) on 11/23/2015 3:36:41 AM       Radiology Dg Chest 2 View  Result Date: 11/23/2015 CLINICAL DATA:  Chest pain.  Chest pressure for 1 month. EXAM: CHEST   2 VIEW COMPARISON:  02/28/2015 FINDINGS: The cardiomediastinal contours are normal, stable heart size at the upper limits of normal. Subsegmental atelectasis in the lingula. Pulmonary vasculature is normal. No consolidation, pleural effusion, or pneumothorax. No acute osseous abnormalities are seen. IMPRESSION: Subsegmental atelectasis in the lingula. Otherwise no acute abnormality. Electronically Signed   By: Rubye Oaks M.D.   On: 11/23/2015 03:51    Procedures Procedures (including critical care time)  Medications Ordered in ED Medications  aspirin chewable tablet 324 mg (324 mg Oral Given 11/23/15 0434)  gi cocktail (Maalox,Lidocaine,Donnatal) (30 mLs Oral Given 11/23/15 0434)  potassium chloride SA (K-DUR,KLOR-CON) CR tablet  40 mEq (40 mEq Oral Given 11/23/15 0616)  pantoprazole (PROTONIX) EC tablet 40 mg (40 mg Oral Given 11/23/15 1610)     Initial Impression / Assessment and Plan / ED Course  I have reviewed the triage vital signs and the nursing notes.  Pertinent labs & imaging results that were available during my care of the patient were reviewed by me and considered in my medical decision making (see chart for details).  Clinical Course   Chest pain which really seems to be focused in the epigastric area. I strongly suspect that this is related to GERD or possibly gastric ulcer. ECG is unremarkable and initial troponin is normal. She is given a trial of a GI cocktail with significant relief. It is noted that she is taking famotidine. She will need to be switched to a proton pump inhibitor. She is given a prescription for pantoprazole and advised to discontinue famotidine. Incidental finding on lab workup was hypokalemia. She was given K-Dur in the ED and discharged with prescription for same. Continue to use antacids as needed, follow-up with PCP. Return should symptoms worsen or change.  Final Clinical Impressions(s) / ED Diagnoses   Final diagnoses:  Epigastric pain    Hypokalemia    New Prescriptions Discharge Medication List as of 11/23/2015  5:58 AM    START taking these medications   Details  pantoprazole (PROTONIX) 40 MG tablet Take 1 tablet (40 mg total) by mouth daily., Starting Mon 11/23/2015, Print    potassium chloride SA (K-DUR,KLOR-CON) 20 MEQ tablet Take 1 tablet (20 mEq total) by mouth 2 (two) times daily., Starting Mon 11/23/2015, Print         Dione Booze, MD 11/23/15 [id number]    Dione Booze, MD 11/23/15 [phone]

## 2016-08-21 ENCOUNTER — Encounter (HOSPITAL_BASED_OUTPATIENT_CLINIC_OR_DEPARTMENT_OTHER): Payer: Self-pay | Admitting: Emergency Medicine

## 2016-08-21 ENCOUNTER — Emergency Department (HOSPITAL_BASED_OUTPATIENT_CLINIC_OR_DEPARTMENT_OTHER): Payer: Worker's Compensation

## 2016-08-21 ENCOUNTER — Emergency Department (HOSPITAL_BASED_OUTPATIENT_CLINIC_OR_DEPARTMENT_OTHER)
Admission: EM | Admit: 2016-08-21 | Discharge: 2016-08-21 | Disposition: A | Discharge status: Home / Self Care | Payer: Worker's Compensation | Attending: Emergency Medicine | Admitting: Emergency Medicine

## 2016-08-21 DIAGNOSIS — Y999 Unspecified external cause status: Secondary | ICD-10-CM | POA: Diagnosis not present

## 2016-08-21 DIAGNOSIS — Z79899 Other long term (current) drug therapy: Secondary | ICD-10-CM | POA: Diagnosis not present

## 2016-08-21 DIAGNOSIS — E119 Type 2 diabetes mellitus without complications: Secondary | ICD-10-CM | POA: Diagnosis not present

## 2016-08-21 DIAGNOSIS — S82839A Other fracture of upper and lower end of unspecified fibula, initial encounter for closed fracture: Secondary | ICD-10-CM

## 2016-08-21 DIAGNOSIS — Y929 Unspecified place or not applicable: Secondary | ICD-10-CM | POA: Diagnosis not present

## 2016-08-21 DIAGNOSIS — S82831A Other fracture of upper and lower end of right fibula, initial encounter for closed fracture: Secondary | ICD-10-CM | POA: Diagnosis not present

## 2016-08-21 DIAGNOSIS — Z794 Long term (current) use of insulin: Secondary | ICD-10-CM | POA: Diagnosis not present

## 2016-08-21 DIAGNOSIS — X58XXXA Exposure to other specified factors, initial encounter: Secondary | ICD-10-CM | POA: Insufficient documentation

## 2016-08-21 DIAGNOSIS — Y939 Activity, unspecified: Secondary | ICD-10-CM | POA: Diagnosis not present

## 2016-08-21 DIAGNOSIS — I10 Essential (primary) hypertension: Secondary | ICD-10-CM | POA: Insufficient documentation

## 2016-08-21 DIAGNOSIS — S99911A Unspecified injury of right ankle, initial encounter: Secondary | ICD-10-CM | POA: Diagnosis present

## 2016-08-21 MED ORDER — HYDROCODONE-ACETAMINOPHEN 5-325 MG PO TABS: 1.0000 | ORAL_TABLET | ORAL | 0 refills | Status: DC | PRN

## 2016-08-21 MED ORDER — IBUPROFEN 800 MG PO TABS
800.0000 mg | ORAL_TABLET | Freq: Once | ORAL | Status: AC
  Administered 2016-08-21: 800 mg via ORAL
  Filled 2016-08-21: qty 1

## 2016-08-21 MED ORDER — IBUPROFEN 600 MG PO TABS: 600.0000 mg | ORAL_TABLET | Freq: Four times a day (QID) | ORAL | 0 refills | Status: DC | PRN

## 2016-08-21 NOTE — ED Provider Notes (Signed)
[facility] Provider Note   CSN: [id number] Arrival date & time: 08/21/16  1516 By signing my name below, I, Levon Hedger, attest that this documentation has been prepared under the direction and in the presence of Jacalyn Lefevre, MD . Electronically Signed: Levon Hedger, Scribe. 08/21/2016. 5:07 PM.   History   Chief Complaint Chief Complaint  Patient presents with  . Ankle Pain   HPI Michelle Mercer is a 50 y.o. female who presents to the Emergency Department complaining of sudden onset, constant, moderate right ankle pain s/p fall two days ago. Pt states she twisted her right ankle and fell on the playground while at work. No OTC treatments tried for these symptoms PTA.  She denies any other injury. Pt has no other acute complaints or associated symptoms at this time.    The history is provided by the patient. No language interpreter was used.   Past Medical History:  Diagnosis Date  . Diabetes mellitus without complication (HCC)   . Hypertension   . Iron deficiency anemia, unspecified 07/10/2012  . Type II or unspecified type diabetes mellitus without mention of complication, not stated as uncontrolled 07/10/2012    Patient Active Problem List   Diagnosis Date Noted  . Iron deficiency anemia, unspecified 07/10/2012  . Type II or unspecified type diabetes mellitus without mention of complication, not stated as uncontrolled 07/10/2012    Past Surgical History:  Procedure Laterality Date  . BACK SURGERY    . CESAREAN SECTION    . CHOLECYSTECTOMY    . TUBAL LIGATION      OB History    No data available       Home Medications    Prior to Admission medications   Medication Sig Start Date End Date Taking? Authorizing Provider  amLODipine (NORVASC) 5 MG tablet Take 5 mg by mouth daily.    [provider]  Cyanocobalamin (VITAMIN B 12 PO) Take by mouth every morning.    [provider]  HYDROcodone-acetaminophen (NORCO/VICODIN) 5-325 MG  tablet Take 1 tablet by mouth every 4 (four) hours as needed. 08/21/16   Jacalyn Lefevre, MD  ibuprofen (ADVIL,MOTRIN) 600 MG tablet Take 1 tablet (600 mg total) by mouth every 6 (six) hours as needed. 08/21/16   Jacalyn Lefevre, MD  insulin detemir (LEVEMIR) 100 UNIT/ML injection Inject 0.4 mLs (40 Units total) into the skin 2 (two) times daily. 11/06/13   Blake Divine, MD  insulin lispro (HUMALOG) 100 UNIT/ML injection Inject into the skin 3 (three) times daily before meals.    [provider]  LISINOPRIL PO Take by mouth every morning.     [provider]  losartan (COZAAR) 100 MG tablet Take 100 mg by mouth daily.    [provider]  naproxen (NAPROSYN) 500 MG tablet Take 1 tablet (500 mg total) by mouth 2 (two) times daily as needed for headache. 02/19/15   Molpus, Jonny Ruiz, MD  ondansetron (ZOFRAN-ODT) 8 MG disintegrating tablet Take 1 tablet (8 mg total) by mouth every 8 (eight) hours as needed for nausea or vomiting. 02/19/15   Molpus, John, MD  pantoprazole (PROTONIX) 40 MG tablet Take 1 tablet (40 mg total) by mouth daily. 11/23/15   Dione Booze, MD  potassium chloride SA (K-DUR,KLOR-CON) 20 MEQ tablet Take 1 tablet (20 mEq total) by mouth 2 (two) times daily. 11/23/15   Dione Booze, MD    Family History History reviewed. No pertinent family history.  Social History Social History  Substance Use Topics  .  Smoking status: Never Smoker  . Smokeless tobacco: Never Used  . Alcohol use Yes     Comment: occasional    Allergies   Patient has no known allergies.  Review of Systems Review of Systems All systems reviewed and are negative for acute change except as noted in the HPI.  Physical Exam Updated Vital Signs BP 139/68 (BP Location: Left Arm)   Pulse 94   Temp 98.3 F (36.8 C) (Oral)   Resp 18   Ht 5\' 1"  (1.549 m)   Wt 95.3 kg (210 lb)   SpO2 100%   BMI 39.68 kg/m   Physical Exam  Constitutional: She is oriented to person, place, and time. She  appears well-developed and well-nourished. No distress.  HENT:  Head: Normocephalic and atraumatic.  Eyes: Conjunctivae are normal.  Cardiovascular: Normal rate.   Pulmonary/Chest: Effort normal.  Abdominal: She exhibits no distension.  Musculoskeletal:  Right ankle tenderness to the lateral malleolus, mild swelling  Neurological: She is alert and oriented to person, place, and time.  Skin: Skin is warm and dry.  Nursing note and vitals reviewed.  ED Treatments / Results  DIAGNOSTIC STUDIES:  Oxygen Saturation is 100% on RA, normal by my interpretation.    COORDINATION OF CARE:  5:00 PM Discussed treatment plan with pt at bedside and pt agreed to plan.   Labs (all labs ordered are listed, but only abnormal results are displayed) Labs Reviewed - No data to display  EKG  EKG Interpretation None      Radiology Dg Ankle Complete Right  Result Date: 08/21/2016 CLINICAL DATA:  Pain after fall. EXAM: RIGHT ANKLE - COMPLETE 3+ VIEW COMPARISON:  None. FINDINGS: Soft tissue swelling is seen, particularly laterally. There is a fracture through the distal tip of the fibula, likely due to an avulsion injury. The distal tibia is intact. The ankle mortise is preserved. There is a moderate plantar spur. No other abnormalities in the ankle. Irregularity of the distal fifth metatarsal is suspected on oblique and lateral imaging, not seen on the January 16, 2016 x-ray of the foot. No other abnormalities. IMPRESSION: 1. Fracture through the distal tip of the fibula is likely an avulsion injury/fracture. Overlying soft tissue swelling. 2. Possible irregularity in the distal half of the fifth metatarsal on oblique and lateral imaging is nonspecific and incompletely visualized. If the patient has pain in this region, recommend dedicated imaging of the foot. Electronically Signed   By: Gerome Samavid  Williams III M.D   On: 08/21/2016 16:56   No foot pain.  Procedures Procedures (including critical care  time)  Medications Ordered in ED Medications  ibuprofen (ADVIL,MOTRIN) tablet 800 mg (not administered)     Initial Impression / Assessment and Plan / ED Course  I have reviewed the triage vital signs and the nursing notes.  Pertinent labs & imaging results that were available during my care of the patient were reviewed by me and considered in my medical decision making (see chart for details).    Pt has an avulsion fx of her distal fibula.  She will be placed in an ankle splint.  She does not want crutches.  She knows to weight bear as tolerated.    Final Clinical Impressions(s) / ED Diagnoses   Final diagnoses:  Avulsion fracture of distal fibula    New Prescriptions New Prescriptions   HYDROCODONE-ACETAMINOPHEN (NORCO/VICODIN) 5-325 MG TABLET    Take 1 tablet by mouth every 4 (four) hours as needed.   IBUPROFEN (ADVIL,MOTRIN) 600  MG TABLET    Take 1 tablet (600 mg total) by mouth every 6 (six) hours as needed.   I personally performed the services described in this documentation, which was scribed in my presence. The recorded information has been reviewed and is accurate.    Jacalyn Lefevre, MD 08/21/16 [phone]

## 2016-08-21 NOTE — ED Triage Notes (Signed)
Patient states that she fell at work on Friday - she works at [employer]. The patient did not notify her work she was coming and she is unsure if she needs a UDS

## 2016-09-02 ENCOUNTER — Ambulatory Visit: Payer: Worker's Compensation | Admitting: Family Medicine

## 2017-04-19 ENCOUNTER — Emergency Department (HOSPITAL_BASED_OUTPATIENT_CLINIC_OR_DEPARTMENT_OTHER)
Admission: EM | Admit: 2017-04-19 | Discharge: 2017-04-19 | Disposition: A | Discharge status: Home / Self Care | Payer: BLUE CROSS/BLUE SHIELD | Attending: Emergency Medicine | Admitting: Emergency Medicine

## 2017-04-19 ENCOUNTER — Other Ambulatory Visit: Payer: Self-pay

## 2017-04-19 ENCOUNTER — Encounter (HOSPITAL_BASED_OUTPATIENT_CLINIC_OR_DEPARTMENT_OTHER): Payer: Self-pay

## 2017-04-19 DIAGNOSIS — J029 Acute pharyngitis, unspecified: Secondary | ICD-10-CM | POA: Diagnosis not present

## 2017-04-19 DIAGNOSIS — E119 Type 2 diabetes mellitus without complications: Secondary | ICD-10-CM | POA: Insufficient documentation

## 2017-04-19 DIAGNOSIS — Z794 Long term (current) use of insulin: Secondary | ICD-10-CM | POA: Insufficient documentation

## 2017-04-19 DIAGNOSIS — I10 Essential (primary) hypertension: Secondary | ICD-10-CM | POA: Insufficient documentation

## 2017-04-19 DIAGNOSIS — R509 Fever, unspecified: Secondary | ICD-10-CM | POA: Insufficient documentation

## 2017-04-19 DIAGNOSIS — Z79899 Other long term (current) drug therapy: Secondary | ICD-10-CM | POA: Insufficient documentation

## 2017-04-19 DIAGNOSIS — R05 Cough: Secondary | ICD-10-CM | POA: Diagnosis not present

## 2017-04-19 DIAGNOSIS — R6889 Other general symptoms and signs: Secondary | ICD-10-CM

## 2017-04-19 MED ORDER — GUAIFENESIN-CODEINE 100-10 MG/5ML PO SYRP: 5.0000 mL | ORAL_SOLUTION | Freq: Three times a day (TID) | ORAL | 0 refills | Status: DC | PRN

## 2017-04-19 MED ORDER — ACETAMINOPHEN 500 MG PO TABS
1000.0000 mg | ORAL_TABLET | Freq: Once | ORAL | Status: AC
  Administered 2017-04-19: 1000 mg via ORAL
  Filled 2017-04-19: qty 2

## 2017-04-19 MED ORDER — OSELTAMIVIR PHOSPHATE 75 MG PO CAPS: 75.0000 mg | ORAL_CAPSULE | Freq: Two times a day (BID) | ORAL | 0 refills | Status: DC

## 2017-04-19 MED ORDER — IBUPROFEN 800 MG PO TABS
800.0000 mg | ORAL_TABLET | Freq: Once | ORAL | Status: AC
  Administered 2017-04-19: 800 mg via ORAL
  Filled 2017-04-19: qty 1

## 2017-04-19 NOTE — ED Triage Notes (Signed)
C/o flu like sx started 4am-NAD-steady gait

## 2017-04-19 NOTE — Discharge Instructions (Signed)
I have written you a prescription for Tamiflu should you decide to take it.  Please take Tylenol at home as needed for fever.  You can take ibuprofen as needed for body aches and headache.  Please get plenty of rest and drink fluids at home.  I have written you a work note.  Return to the emergency department if you have trouble breathing, vomiting that will not stop or have any new or concerning symptoms.

## 2017-04-19 NOTE — ED Provider Notes (Signed)
[facility] EMERGENCY DEPARTMENT Provider Note   CSN: [id number] Arrival date & time: 04/19/17  1835     History   Chief Complaint Chief Complaint  Patient presents with  . Cough    HPI Michelle Mercer is a 52 y.o. female.  HPI  [name] is a 52yo female with a history of type 2 diabetes and hypertension who presents to the emergency department for evaluation of fever, cough, sore throat, body aches.  Patient states that her symptoms began about 4 AM today.  Reports that she had a tactile fever with chills.  Has a nonproductive cough.  Endorses body aches primarily in her arms.  States that her sore throat feels "burning" in nature and is about a 7/10 in severity.  Pain is worsened when she swallows.  She has not taken any over-the-counter medications for her symptoms.  Reports that she has several coworkers that are sick with similar.  She did get her flu shot this year.  She is otherwise been able to eat and drink normally.  Denies headache, ear pain, neck pain/stiffness, shortness of breath, chest pain, abdominal pain, vomiting, diarrhea, dysuria, urinary frequency, lightheadedness, dysphagia.   Past Medical History:  Diagnosis Date  . Diabetes mellitus without complication (HCC)   . Hypertension   . Iron deficiency anemia, unspecified 07/10/2012  . Type II or unspecified type diabetes mellitus without mention of complication, not stated as uncontrolled 07/10/2012    Patient Active Problem List   Diagnosis Date Noted  . Iron deficiency anemia, unspecified 07/10/2012  . Type II or unspecified type diabetes mellitus without mention of complication, not stated as uncontrolled 07/10/2012    Past Surgical History:  Procedure Laterality Date  . BACK SURGERY    . CESAREAN SECTION    . CHOLECYSTECTOMY    . TUBAL LIGATION      OB History    No data available       Home Medications    Prior to Admission medications   Medication Sig Start Date End Date Taking?  Authorizing Provider  amLODipine (NORVASC) 5 MG tablet Take 5 mg by mouth daily.    [provider]  Cyanocobalamin (VITAMIN B 12 PO) Take by mouth every morning.    [provider]  insulin detemir (LEVEMIR) 100 UNIT/ML injection Inject 0.4 mLs (40 Units total) into the skin 2 (two) times daily. 11/06/13   Blake Divine, MD  insulin lispro (HUMALOG) 100 UNIT/ML injection Inject into the skin 3 (three) times daily before meals.    [provider]  LISINOPRIL PO Take by mouth every morning.     [provider]  losartan (COZAAR) 100 MG tablet Take 100 mg by mouth daily.    [provider]  ondansetron (ZOFRAN-ODT) 8 MG disintegrating tablet Take 1 tablet (8 mg total) by mouth every 8 (eight) hours as needed for nausea or vomiting. 02/19/15   Molpus, John, MD  pantoprazole (PROTONIX) 40 MG tablet Take 1 tablet (40 mg total) by mouth daily. 11/23/15   Dione Booze, MD  potassium chloride SA (K-DUR,KLOR-CON) 20 MEQ tablet Take 1 tablet (20 mEq total) by mouth 2 (two) times daily. 11/23/15   Dione Booze, MD    Family History No family history on file.  Social History Social History   Tobacco Use  . Smoking status: Never Smoker  . Smokeless tobacco: Never Used  Substance Use Topics  . Alcohol use: Yes    Comment: occasional  . Drug use: No  Allergies   Patient has no known allergies.   Review of Systems Review of Systems  Constitutional: Positive for chills and fever.  HENT: Positive for sore throat. Negative for congestion, ear pain and trouble swallowing.   Respiratory: Positive for cough. Negative for shortness of breath.   Cardiovascular: Negative for chest pain.  Gastrointestinal: Negative for abdominal pain, diarrhea and vomiting.  Genitourinary: Negative for dysuria, flank pain and frequency.  Musculoskeletal: Positive for myalgias (arms).  Skin: Negative for rash.  Neurological: Negative for light-headedness and headaches.      Physical Exam Updated Vital Signs BP 124/69 (BP Location: Left Arm)   Pulse (!) 106   Temp (!) 100.5 F (38.1 C) (Oral)   Resp 20   Ht 5\' 1"  (1.549 m)   Wt 88.9 kg (196 lb)   SpO2 95%   BMI 37.03 kg/m   Physical Exam  Constitutional: She is oriented to person, place, and time. She appears well-developed and well-nourished. No distress.  Sitting at bedside in no apparent distress.   HENT:  Head: Normocephalic and atraumatic.  Postnasal drip present.  Mucous membranes moist.  Posterior oropharynx without erythema.  Uvula midline.  Airway patent and able to handle oral secretions.  Eyes: Conjunctivae are normal. Pupils are equal, round, and reactive to light. Right eye exhibits no discharge. Left eye exhibits no discharge.  Neck: Normal range of motion. Neck supple.  Cardiovascular: Normal rate, regular rhythm and intact distal pulses. Exam reveals no friction rub.  No murmur heard. Pulmonary/Chest: Effort normal and breath sounds normal. No stridor. No respiratory distress. She has no wheezes. She has no rales.  Lungs clear to auscultation.  Abdominal: Soft. Bowel sounds are normal. There is no tenderness. There is no guarding.  Musculoskeletal: Normal range of motion.  Lymphadenopathy:    She has no cervical adenopathy.  Neurological: She is alert and oriented to person, place, and time. Coordination normal.  Skin: Skin is warm and dry. She is not diaphoretic.  Psychiatric: She has a normal mood and affect. Her behavior is normal.  Nursing note and vitals reviewed.    ED Treatments / Results  Labs (all labs ordered are listed, but only abnormal results are displayed) Labs Reviewed - No data to display  EKG  EKG Interpretation None       Radiology No results found.  Procedures Procedures (including critical care time)  Medications Ordered in ED Medications  ibuprofen (ADVIL,MOTRIN) tablet 800 mg (800 mg Oral Given 04/19/17 1855)  acetaminophen (TYLENOL)  tablet 1,000 mg (1,000 mg Oral Given 04/19/17 2033)     Initial Impression / Assessment and Plan / ED Course  I have reviewed the triage vital signs and the nursing notes.  Pertinent labs & imaging results that were available during my care of the patient were reviewed by me and considered in my medical decision making (see chart for details).    Patient with symptoms consistent with influenza.  Vitals are stable, low-grade fever.  No signs of dehydration, tolerating PO's.  Lungs are clear. Due to patient's presentation and physical exam a chest x-ray was not ordered bc likely diagnosis of flu.  Discussed the cost versus benefit of Tamiflu treatment with the patient.  The patient understands that symptoms are within the recommended 24-48 hour window of treatment and elects to be treated.  I think this is appropriate.  Patient will be discharged with instructions to orally hydrate, rest, and use over-the-counter medications such as anti-inflammatories ibuprofen and Aleve for  muscle aches and Tylenol for fever.  Patient will also be given a cough suppressant.    Final Clinical Impressions(s) / ED Diagnoses   Final diagnoses:  Flu-like symptoms    ED Discharge Orders        Ordered    oseltamivir (TAMIFLU) 75 MG capsule  Every 12 hours     04/19/17 2056    guaiFENesin-codeine (ROBITUSSIN AC) 100-10 MG/5ML syrup  3 times daily PRN     04/19/17 2056       Kellie ShropshireShrosbree, Maikel Neisler J, PA-C 04/19/17 2359    Alvira MondaySchlossman, Erin, MD 04/20/17 1301

## 2017-06-02 ENCOUNTER — Emergency Department (HOSPITAL_BASED_OUTPATIENT_CLINIC_OR_DEPARTMENT_OTHER): Payer: BLUE CROSS/BLUE SHIELD

## 2017-06-02 ENCOUNTER — Emergency Department (HOSPITAL_BASED_OUTPATIENT_CLINIC_OR_DEPARTMENT_OTHER)
Admission: EM | Admit: 2017-06-02 | Discharge: 2017-06-03 | Disposition: A | Discharge status: Home / Self Care | Payer: BLUE CROSS/BLUE SHIELD | Attending: Emergency Medicine | Admitting: Emergency Medicine

## 2017-06-02 ENCOUNTER — Encounter (HOSPITAL_BASED_OUTPATIENT_CLINIC_OR_DEPARTMENT_OTHER): Payer: Self-pay | Admitting: *Deleted

## 2017-06-02 ENCOUNTER — Other Ambulatory Visit: Payer: Self-pay

## 2017-06-02 DIAGNOSIS — M5412 Radiculopathy, cervical region: Secondary | ICD-10-CM

## 2017-06-02 DIAGNOSIS — R1013 Epigastric pain: Secondary | ICD-10-CM | POA: Insufficient documentation

## 2017-06-02 DIAGNOSIS — Z79899 Other long term (current) drug therapy: Secondary | ICD-10-CM | POA: Insufficient documentation

## 2017-06-02 DIAGNOSIS — I10 Essential (primary) hypertension: Secondary | ICD-10-CM | POA: Insufficient documentation

## 2017-06-02 DIAGNOSIS — Z794 Long term (current) use of insulin: Secondary | ICD-10-CM | POA: Diagnosis not present

## 2017-06-02 DIAGNOSIS — E119 Type 2 diabetes mellitus without complications: Secondary | ICD-10-CM | POA: Insufficient documentation

## 2017-06-02 LAB — CBC
HCT: 35.7 % — ABNORMAL LOW (ref 36.0–46.0)
Hemoglobin: 11.5 g/dL — ABNORMAL LOW (ref 12.0–15.0)
MCH: 21.3 pg — ABNORMAL LOW (ref 26.0–34.0)
MCHC: 32.2 g/dL (ref 30.0–36.0)
MCV: 66.2 fL — ABNORMAL LOW (ref 78.0–100.0)
PLATELETS: 544 10*3/uL — AB (ref 150–400)
RBC: 5.39 MIL/uL — ABNORMAL HIGH (ref 3.87–5.11)
RDW: 18.1 % — AB (ref 11.5–15.5)
WBC: 10.3 10*3/uL (ref 4.0–10.5)

## 2017-06-02 LAB — BASIC METABOLIC PANEL
Anion gap: 10 (ref 5–15)
BUN: 20 mg/dL (ref 6–20)
CALCIUM: 9.3 mg/dL (ref 8.9–10.3)
CO2: 27 mmol/L (ref 22–32)
CREATININE: 0.99 mg/dL (ref 0.44–1.00)
Chloride: 101 mmol/L (ref 101–111)
GFR calc Af Amer: >60 mL/min (ref >60)
Glucose, Bld: 144 mg/dL — ABNORMAL HIGH (ref 65–99)
Potassium: 3.5 mmol/L (ref 3.5–5.1)
SODIUM: 138 mmol/L (ref 135–145)

## 2017-06-02 LAB — TROPONIN I: Troponin I: <0.03 ng/mL (ref <0.03)

## 2017-06-02 NOTE — ED Triage Notes (Signed)
epigastric pain and right arm pain for a month. Hx of acid reflux. She took rx meds for GERD for 2 weeks but stopped.

## 2017-06-03 ENCOUNTER — Emergency Department (HOSPITAL_BASED_OUTPATIENT_CLINIC_OR_DEPARTMENT_OTHER): Payer: BLUE CROSS/BLUE SHIELD

## 2017-06-03 LAB — HEPATIC FUNCTION PANEL
ALBUMIN: 4 g/dL (ref 3.5–5.0)
ALT: 15 U/L (ref 14–54)
AST: 13 U/L — AB (ref 15–41)
Alkaline Phosphatase: 96 U/L (ref 38–126)
BILIRUBIN TOTAL: 0.3 mg/dL (ref 0.3–1.2)
Total Protein: 8.2 g/dL — ABNORMAL HIGH (ref 6.5–8.1)

## 2017-06-03 LAB — D-DIMER, QUANTITATIVE (NOT AT ARMC): D DIMER QUANT: 0.35 ug{FEU}/mL (ref 0.00–0.50)

## 2017-06-03 LAB — LIPASE, BLOOD: Lipase: 46 U/L (ref 11–51)

## 2017-06-03 LAB — TROPONIN I

## 2017-06-03 MED ORDER — HYDROCODONE-ACETAMINOPHEN 5-325 MG PO TABS
1.0000 | ORAL_TABLET | Freq: Once | ORAL | Status: AC
  Administered 2017-06-03: 1 via ORAL
  Filled 2017-06-03: qty 1

## 2017-06-03 MED ORDER — GI COCKTAIL ~~LOC~~
30.0000 mL | Freq: Once | ORAL | Status: DC
  Filled 2017-06-03: qty 30

## 2017-06-03 MED ORDER — FENTANYL CITRATE (PF) 100 MCG/2ML IJ SOLN: 50.0000 ug | Freq: Once | INTRAMUSCULAR | Status: DC

## 2017-06-03 MED ORDER — ONDANSETRON 4 MG PO TBDP
4.0000 mg | ORAL_TABLET | Freq: Once | ORAL | Status: AC
  Administered 2017-06-03: 4 mg via ORAL
  Filled 2017-06-03: qty 1

## 2017-06-03 MED ORDER — OMEPRAZOLE 20 MG PO CPDR: 20.0000 mg | DELAYED_RELEASE_CAPSULE | Freq: Every day | ORAL | 0 refills | Status: DC

## 2017-06-03 MED ORDER — ONDANSETRON HCL 4 MG/2ML IJ SOLN: 4.0000 mg | Freq: Once | INTRAMUSCULAR | Status: DC

## 2017-06-03 MED ORDER — SUCRALFATE 1 G PO TABS: 1.0000 g | ORAL_TABLET | Freq: Three times a day (TID) | ORAL | 0 refills | Status: DC

## 2017-06-03 NOTE — ED Provider Notes (Signed)
[facility] EMERGENCY DEPARTMENT Provider Note   CSN: [id number] Arrival date & time: 06/02/17  2154     History   Chief Complaint Chief Complaint  Patient presents with  . Chest Pain    HPI Michelle Mercer is a 52 y.o. female.  Patient with history of hypertension, diabetes and GERD presenting with epigastric pain that she is been having intermittently for the past 1 month.  She states the pain comes and goes lasting for several hours at a time.  She normally takes antacids at home and is not having any pain right now.  She became concerned tonight when she had this epigastric pain coupled with pain in her right arm.  She has had ongoing right arm pain for the past 1 month that is constant and never goes away.  The pain starts at her right-sided neck and shoulder and radiates down her entire arm.  She denies any falls or injuries.  She denies any weakness, numbness or tingling.  She is been taking aspirin and Goody powders for this pain with minimal relief.  She denies fevers, nausea, vomiting, syncope, shortness of breath.  She denies any abdominal pain or back pain.  The history is provided by the patient.  Chest Pain   Associated symptoms include abdominal pain and nausea. Pertinent negatives include no dizziness, no fever, no headaches, no shortness of breath, no vomiting and no weakness.    Past Medical History:  Diagnosis Date  . Diabetes mellitus without complication (HCC)   . Hypertension   . Iron deficiency anemia, unspecified 07/10/2012  . Type II or unspecified type diabetes mellitus without mention of complication, not stated as uncontrolled 07/10/2012    Patient Active Problem List   Diagnosis Date Noted  . Iron deficiency anemia, unspecified 07/10/2012  . Type II or unspecified type diabetes mellitus without mention of complication, not stated as uncontrolled 07/10/2012    Past Surgical History:  Procedure Laterality Date  . BACK SURGERY    . CESAREAN  SECTION    . CHOLECYSTECTOMY    . TUBAL LIGATION       OB History   None      Home Medications    Prior to Admission medications   Medication Sig Start Date End Date Taking? Authorizing Provider  amLODipine (NORVASC) 5 MG tablet Take 5 mg by mouth daily.    [provider]  Cyanocobalamin (VITAMIN B 12 PO) Take by mouth every morning.    [provider]  guaiFENesin-codeine (ROBITUSSIN AC) 100-10 MG/5ML syrup Take 5 mLs by mouth 3 (three) times daily as needed for cough. 04/19/17   Kellie Shropshire, PA-C  insulin detemir (LEVEMIR) 100 UNIT/ML injection Inject 0.4 mLs (40 Units total) into the skin 2 (two) times daily. 11/06/13   Blake Divine, MD  insulin lispro (HUMALOG) 100 UNIT/ML injection Inject into the skin 3 (three) times daily before meals.    [provider]  LISINOPRIL PO Take by mouth every morning.     [provider]  losartan (COZAAR) 100 MG tablet Take 100 mg by mouth daily.    [provider]  ondansetron (ZOFRAN-ODT) 8 MG disintegrating tablet Take 1 tablet (8 mg total) by mouth every 8 (eight) hours as needed for nausea or vomiting. 02/19/15   Molpus, John, MD  oseltamivir (TAMIFLU) 75 MG capsule Take 1 capsule (75 mg total) by mouth every 12 (twelve) hours. 04/19/17   Kellie Shropshire, PA-C  pantoprazole (PROTONIX) 40 MG tablet  Take 1 tablet (40 mg total) by mouth daily. 11/23/15   Dione Booze, MD  potassium chloride SA (K-DUR,KLOR-CON) 20 MEQ tablet Take 1 tablet (20 mEq total) by mouth 2 (two) times daily. 11/23/15   Dione Booze, MD    Family History No family history on file.  Social History Social History   Tobacco Use  . Smoking status: Never Smoker  . Smokeless tobacco: Never Used  Substance Use Topics  . Alcohol use: Yes    Comment: occasional  . Drug use: No     Allergies   Patient has no known allergies.   Review of Systems Review of Systems  Constitutional: Negative for activity change, appetite  change and fever.  HENT: Negative for congestion and rhinorrhea.   Eyes: Negative for visual disturbance.  Respiratory: Positive for chest tightness. Negative for shortness of breath.   Cardiovascular: Positive for chest pain.  Gastrointestinal: Positive for abdominal pain and nausea. Negative for vomiting.  Genitourinary: Negative for dysuria and vaginal discharge.  Musculoskeletal: Positive for arthralgias and myalgias.  Skin: Negative for rash.  Neurological: Negative for dizziness, weakness and headaches.   all other systems are negative except as noted in the HPI and PMH.     Physical Exam Updated Vital Signs BP (!) 158/75   Pulse 67   Temp 98.2 F (36.8 C) (Oral)   Resp 18   Ht 5\' 1"  (1.549 m)   Wt 88.9 kg (196 lb)   SpO2 100%   BMI 37.03 kg/m   Physical Exam  Constitutional: She is oriented to person, place, and time. She appears well-developed and well-nourished. No distress.  HENT:  Head: Normocephalic and atraumatic.  Mouth/Throat: Oropharynx is clear and moist. No oropharyngeal exudate.  Eyes: Pupils are equal, round, and reactive to light. Conjunctivae and EOM are normal.  Neck: Normal range of motion. Neck supple.  No meningismus.  Cardiovascular: Normal rate, regular rhythm, normal heart sounds and intact distal pulses.  No murmur heard. Pulmonary/Chest: Effort normal and breath sounds normal. No respiratory distress.  Abdominal: Soft. There is tenderness. There is no rebound and no guarding.  Minimal epigastric pain, no right upper quadrant tenderness  Musculoskeletal: Normal range of motion. She exhibits tenderness. She exhibits no edema.  Right paraspinal paracervical tenderness extending across trapezius.  Equal radial pulses and grip strengths  Neurological: She is alert and oriented to person, place, and time. No cranial nerve deficit. She exhibits normal muscle tone. Coordination normal.  No ataxia on finger to nose bilaterally. No pronator drift. 5/5  strength throughout. CN 2-12 intact.Equal grip strength. Sensation intact.   Skin: Skin is warm.  Psychiatric: She has a normal mood and affect. Her behavior is normal.  Nursing note and vitals reviewed.    ED Treatments / Results  Labs (all labs ordered are listed, but only abnormal results are displayed) Labs Reviewed  BASIC METABOLIC PANEL - Abnormal; Notable for the following components:      Result Value   Glucose, Bld 144 (*)    All other components within normal limits  CBC - Abnormal; Notable for the following components:   RBC 5.39 (*)    Hemoglobin 11.5 (*)    HCT 35.7 (*)    MCV 66.2 (*)    MCH 21.3 (*)    RDW 18.1 (*)    Platelets 544 (*)    All other components within normal limits  HEPATIC FUNCTION PANEL - Abnormal; Notable for the following components:   Total Protein 8.2 (*)  AST 13 (*)    Bilirubin, Direct <0.1 (*)    All other components within normal limits  TROPONIN I  LIPASE, BLOOD  D-DIMER, QUANTITATIVE (NOT AT [facility])  TROPONIN I  PREGNANCY, URINE    EKG EKG Interpretation  Date/Time:  Friday June 02 2017 22:02:25 EDT Ventricular Rate:  67 PR Interval:  140 QRS Duration: 90 QT Interval:  418 QTC Calculation: 441 R Axis:   72 Text Interpretation:  Unusual P axis, possible ectopic atrial rhythm Low voltage QRS Cannot rule out Anterior infarct , age undetermined Abnormal ECG No significant change was found Confirmed by Glynn Octaveancour, Annalisse Minkoff [phone]([id number]) on 06/02/2017 11:53:45 PM   Radiology Dg Chest 2 View  Result Date: 06/02/2017 CLINICAL DATA:  Chest pain, epigastric pain and right arm pain for a month. Hx of acid reflux. She took rx meds for GERD for 2 weeks but stopped Post menopausal; shielded EXAM: CHEST - 2 VIEW COMPARISON:  1097 FINDINGS: Normal mediastinum and cardiac silhouette. Normal pulmonary vasculature. No evidence of effusion, infiltrate, or pneumothorax. No acute bony abnormality. IMPRESSION: Normal chest radiograph Electronically Signed    By: Genevive BiStewart  Edmunds M.D.   On: 06/02/2017 22:57   Dg Cervical Spine Complete  Result Date: 06/03/2017 CLINICAL DATA:  Chronic right arm and epigastric pain. EXAM: CERVICAL SPINE - COMPLETE 4+ VIEW COMPARISON:  None. FINDINGS: There is no evidence of fracture or subluxation. Vertebral bodies demonstrate normal height and alignment. Intervertebral disc spaces are preserved. Prevertebral soft tissues are within normal limits. The provided odontoid view demonstrates no significant abnormality. The visualized lung apices are clear. IMPRESSION: No evidence of fracture or subluxation along the cervical spine. Electronically Signed   By: Roanna RaiderJeffery  Chang M.D.   On: 06/03/2017 00:49    Procedures Procedures (including critical care time)  Medications Ordered in ED Medications - No data to display   Initial Impression / Assessment and Plan / ED Course  I have reviewed the triage vital signs and the nursing notes.  Pertinent labs & imaging results that were available during my care of the patient were reviewed by me and considered in my medical decision making (see chart for details).    1 month of intermittent epigastric pains similar to previous episodes of GERD.  Patient with 1 month of constant right arm pain as well.  These do not appear to be related though the patient may be aggravating her GERD with the NSAIDs she is taking for her arm pain.  Low suspicion for ACS, PE, aortic dissection.  EKG is nonischemic. P waves normal on repeat.   LFTs and lipase normal.  Troponin negative x2.  D-dimer negative.  Low suspicion for ACS or pulmonary embolism.  Suspect her pain is gastritis due to her recent excessive NSAID use.  Patient's arm pain appears to be radicular.  Pain is improved with treatment in the ED.  No weakness or numbness.  Neurovascularly intact.  Will restart PPI.  Advised patient to avoid frequent NSAIDs given her gastritis.  Follow-up with PCP for stress test.  Return precautions  discussed.  Final Clinical Impressions(s) / ED Diagnoses   Final diagnoses:  Epigastric abdominal pain  Cervical radiculopathy    ED Discharge Orders    None       Maisyn Nouri, Jeannett SeniorStephen, MD 06/03/17 0425

## 2017-06-03 NOTE — Discharge Instructions (Addendum)
Take the stomach medication as prescribed.  Avoid alcohol, NSAIDs, caffeine, spicy foods.  Watch her blood sugars carefully while you are taking steroids.  Follow-up with your doctor.  He should also have a stress test scheduled.  Return to the ED if your chest pain becomes exertional, associated with shortness of breath, vomiting, sweating or any other concerns.

## 2017-06-03 NOTE — ED Notes (Signed)
Pt. Reports she always for one month or more has a pain in the R arm. On arrival tonight she had a pain in her mid abd. And felt nauseated with the pain in her R arm and R neck

## 2017-06-03 NOTE — ED Notes (Signed)
Pt. Assessed by RN for Pt. Concerns and Neck pain.  Pt. Told RN she does not want and IV she wants her meds by Mouth

## 2018-10-30 LAB — HM HEPATITIS C SCREENING LAB: HM Hepatitis Screen: NEGATIVE

## 2020-06-18 ENCOUNTER — Other Ambulatory Visit: Payer: Self-pay

## 2020-06-19 ENCOUNTER — Ambulatory Visit (INDEPENDENT_AMBULATORY_CARE_PROVIDER_SITE_OTHER): Payer: 59 | Admitting: Family

## 2020-06-19 ENCOUNTER — Encounter: Payer: Self-pay | Admitting: Family

## 2020-06-19 ENCOUNTER — Other Ambulatory Visit: Payer: Self-pay

## 2020-06-19 VITALS — BP 102/56 | HR 75 | Temp 97.9°F | Ht 61.0 in | Wt 202.0 lb

## 2020-06-19 DIAGNOSIS — I10 Essential (primary) hypertension: Secondary | ICD-10-CM

## 2020-06-19 DIAGNOSIS — G8929 Other chronic pain: Secondary | ICD-10-CM

## 2020-06-19 DIAGNOSIS — Z1231 Encounter for screening mammogram for malignant neoplasm of breast: Secondary | ICD-10-CM

## 2020-06-19 DIAGNOSIS — Z794 Long term (current) use of insulin: Secondary | ICD-10-CM

## 2020-06-19 DIAGNOSIS — E119 Type 2 diabetes mellitus without complications: Secondary | ICD-10-CM

## 2020-06-19 DIAGNOSIS — M25512 Pain in left shoulder: Secondary | ICD-10-CM | POA: Diagnosis not present

## 2020-06-19 DIAGNOSIS — M5441 Lumbago with sciatica, right side: Secondary | ICD-10-CM

## 2020-06-19 DIAGNOSIS — M5442 Lumbago with sciatica, left side: Secondary | ICD-10-CM

## 2020-06-19 MED ORDER — OZEMPIC (1 MG/DOSE) 4 MG/3ML ~~LOC~~ SOPN: PEN_INJECTOR | SUBCUTANEOUS | 1 refills | Status: DC

## 2020-06-19 NOTE — Patient Instructions (Signed)
Please take 2 Meloxicam daily ( 15 mg) daily;

## 2020-06-19 NOTE — Progress Notes (Signed)
Michelle Mercer is a 55 y.o. female with the following history as recorded in Epic[employer]:  Patient Active Problem List   Diagnosis Date Noted  . Iron deficiency anemia, unspecified 07/10/2012  . Type II or unspecified type diabetes mellitus without mention of complication, not stated as uncontrolled 07/10/2012    Current Outpatient Medications  Medication Sig Dispense Refill  . amLODipine (NORVASC) 10 MG tablet Take 1 tablet by mouth daily.    . Cyanocobalamin (VITAMIN B 12 PO) Take by mouth every morning.    . empagliflozin (JARDIANCE) 25 MG TABS tablet Take 1 tablet by mouth daily.    Marland Kitchen glucose blood (PRECISION QID TEST) test strip Monitor BG three to four times daily, before meals and bedtime    . insulin degludec (TRESIBA FLEXTOUCH) 200 UNIT/ML FlexTouch Pen Inject into the skin.    Marland Kitchen insulin lispro (HUMALOG) 100 UNIT/ML injection Inject into the skin 3 (three) times daily before meals.    . Insulin Pen Needle (B-D ULTRAFINE III SHORT PEN) 31G X 8 MM MISC Check TID as directed    . losartan-hydrochlorothiazide (HYZAAR) 100-25 MG tablet Take 1 tablet by mouth daily.    . meloxicam (MOBIC) 7.5 MG tablet Take 1 tablet by mouth daily.    . pioglitazone (ACTOS) 30 MG tablet TAKE ONE TABLET BY MOUTH DAILY. LAST REFILL UNTIL PATIENT IS SEEN IN CLINIC    . Semaglutide, 1 MG/DOSE, (OZEMPIC, 1 MG/DOSE,) 4 MG/3ML SOPN INJECT 0.75 MLS UNDER THE SKIN ONCE WEEKLY 3 mL 1   No current facility-administered medications for this visit.    Allergies: Iodine, Lisinopril, Saxagliptin-metformin er, Lansoprazole, Latex, and Sulfamethoxazole-trimethoprim  Past Medical History:  Diagnosis Date  . Diabetes mellitus without complication ([place])   . Hypertension   . Iron deficiency anemia, unspecified 07/10/2012  . Type II or unspecified type diabetes mellitus without mention of complication, not stated as uncontrolled 07/10/2012    Past Surgical History:  Procedure Laterality Date  . BACK SURGERY    . CESAREAN  SECTION    . CHOLECYSTECTOMY    . TUBAL LIGATION      No family history on file.  Social History   Tobacco Use  . Smoking status: Never Smoker  . Smokeless tobacco: Never Used  Substance Use Topics  . Alcohol use: Yes    Comment: occasional    Subjective:   Presents as a new patient today; transferring from [facility] system with new insurance; Has Type 2 Diabetes- needs to get established with new endocrinologist; has not been able to get Ozempic for the past month; Chronic low back pain radiating into her legs- last MRI done 5 years ago; Left shoulder pain- limited mobility;    Objective:  Vitals:   06/19/20 1407  BP: (!) 102/56  Pulse: 75  Temp: 97.9 F (36.6 C)  TempSrc: Oral  SpO2: 99%  Weight: 202 lb (91.6 kg)  Height: 5' 1"  (1.549 m)    General: Well developed, well nourished, in no acute distress  Skin : Warm and dry.  Head: Normocephalic and atraumatic  Eyes: Sclera and conjunctiva clear; pupils round and reactive to light; extraocular movements intact  Ears: External normal; canals clear; tympanic membranes normal  Oropharynx: Pink, supple. No suspicious lesions  Neck: Supple without thyromegaly, adenopathy  Lungs: Respirations unlabored; clear to auscultation bilaterally without wheeze, rales, rhonchi  CVS exam: normal rate and regular rhythm.  Abdomen: Soft; nontender; nondistended; normoactive bowel sounds; no masses or hepatosplenomegaly  Musculoskeletal: No deformities; no  active joint inflammation  Extremities: No edema, cyanosis, clubbing  Vessels: Symmetric bilaterally  Neurologic: Alert and oriented; speech intact; face symmetrical; moves all extremities well; CNII-XII intact without focal deficit  Assessment:  1. Type 2 diabetes mellitus without complication, with long-term current use of insulin ([place])   2. Primary hypertension   3. Screening mammogram for breast cancer   4. Chronic left shoulder pain   5. Chronic bilateral low back  pain with bilateral sciatica     Plan:  1. Rx for Ozempic- may need to consider alternative such as Trulicity; refer to endocrine; check Hgba1c today; 2. Stable- concern that pressure might be too low; will need to monitor; 3. Order updated; 4. & 5. Try increasing Mobic to 15 mg daily; refer to orthopedist- suspect she needs lumbar MRI and possible shoulder MRI;  Follow up to be determined based on labs;  This visit occurred during the SARS-CoV-2 public health emergency.  Safety protocols were in place, including screening questions prior to the visit, additional usage of staff PPE, and extensive cleaning of exam room while observing appropriate contact time as indicated for disinfecting solutions.     No follow-ups on file.  Orders Placed This Encounter  Procedures  . MM Digital Screening    Standing Status:   Future    Standing Expiration Date:   06/19/2021    Order Specific Question:   Reason for Exam (SYMPTOM  OR DIAGNOSIS REQUIRED)    Answer:   screening mammogram    Order Specific Question:   Is the patient pregnant?    Answer:   No    Order Specific Question:   Preferred imaging location?    Answer:   [facility]  . CBC with Differential/Platelet  . Comp Met (CMET)  . Hemoglobin A1c  . Ambulatory referral to Endocrinology    Referral Priority:   Routine    Referral Type:   Consultation    Referral Reason:   Specialty Services Required    Referred to Provider:   Shamleffer, Melanie Crazier, MD    Number of Visits Requested:   1  . Ambulatory referral to Orthopedic Surgery    Referral Priority:   Routine    Referral Type:   Surgical    Referral Reason:   Specialty Services Required    Requested Specialty:   Orthopedic Surgery    Number of Visits Requested:   1    Requested Prescriptions   Signed Prescriptions Disp Refills  . Semaglutide, 1 MG/DOSE, (OZEMPIC, 1 MG/DOSE,) 4 MG/3ML SOPN 3 mL 1    Sig: INJECT 0.75 MLS UNDER THE SKIN ONCE WEEKLY

## 2020-06-20 LAB — CBC WITH DIFFERENTIAL/PLATELET
Absolute Monocytes: 564 cells/uL (ref 200–950)
Basophils Absolute: 82 cells/uL (ref 0–200)
Basophils Relative: 0.9 %
Eosinophils Absolute: 255 cells/uL (ref 15–500)
Eosinophils Relative: 2.8 %
HCT: 37.7 % (ref 35.0–45.0)
Hemoglobin: 11.6 g/dL — ABNORMAL LOW (ref 11.7–15.5)
Lymphs Abs: 2166 cells/uL (ref 850–3900)
MCH: 21.7 pg — ABNORMAL LOW (ref 27.0–33.0)
MCHC: 30.8 g/dL — ABNORMAL LOW (ref 32.0–36.0)
MCV: 70.6 fL — ABNORMAL LOW (ref 80.0–100.0)
MPV: 11.2 fL (ref 7.5–12.5)
Monocytes Relative: 6.2 %
Neutro Abs: 6033 cells/uL (ref 1500–7800)
Neutrophils Relative %: 66.3 %
Platelets: 513 10*3/uL — ABNORMAL HIGH (ref 140–400)
RBC: 5.34 10*6/uL — ABNORMAL HIGH (ref 3.80–5.10)
RDW: 15.8 % — ABNORMAL HIGH (ref 11.0–15.0)
Total Lymphocyte: 23.8 %
WBC: 9.1 10*3/uL (ref 3.8–10.8)

## 2020-06-20 LAB — COMPREHENSIVE METABOLIC PANEL
AG Ratio: 1.1 (calc) (ref 1.0–2.5)
ALT: 12 U/L (ref 6–29)
AST: 10 U/L (ref 10–35)
Albumin: 4 g/dL (ref 3.6–5.1)
Alkaline phosphatase (APISO): 75 U/L (ref 37–153)
BUN/Creatinine Ratio: 18 (calc) (ref 6–22)
BUN: 24 mg/dL (ref 7–25)
CO2: 29 mmol/L (ref 20–32)
Calcium: 9.2 mg/dL (ref 8.6–10.4)
Chloride: 100 mmol/L (ref 98–110)
Creat: 1.3 mg/dL — ABNORMAL HIGH (ref 0.50–1.05)
Globulin: 3.5 g/dL (calc) (ref 1.9–3.7)
Glucose, Bld: 200 mg/dL — ABNORMAL HIGH (ref 65–99)
Potassium: 3.8 mmol/L (ref 3.5–5.3)
Sodium: 139 mmol/L (ref 135–146)
Total Bilirubin: 0.3 mg/dL (ref 0.2–1.2)
Total Protein: 7.5 g/dL (ref 6.1–8.1)

## 2020-06-20 LAB — HEMOGLOBIN A1C
Hgb A1c MFr Bld: 7.7 % of total Hgb — ABNORMAL HIGH (ref <5.7)
Mean Plasma Glucose: 174 mg/dL
eAG (mmol/L): 9.7 mmol/L

## 2020-07-28 ENCOUNTER — Ambulatory Visit: Payer: 59 | Admitting: Internal Medicine

## 2020-07-28 NOTE — Progress Notes (Deleted)
Name: Michelle Mercer  MRN/ DOB: [id number], 08-Mar-1965   Age/ Sex: 55 y.o., female    PCP: Olive Bass, FNP   Reason for Endocrinology Evaluation: Type 2 Diabetes Mellitus     Date of Initial Endocrinology Visit: 07/28/2020     PATIENT IDENTIFIER: Michelle Mercer is a 55 y.o. female with a past medical history of T2DM, Dyslipidemia and HTN . The patient presented for initial endocrinology clinic visit on 07/28/2020 for consultative assistance with her diabetes management.    HPI: Ms. Rosete was    Diagnosed with DM in her 61's  Prior Medications tried/Intolerance: *** Currently checking blood sugars *** x / day,  before breakfast and ***.  Hypoglycemia episodes : ***               Symptoms: ***                 Frequency: ***/  Hemoglobin A1c has ranged from 7.1% in 2021, peaking at 15.0% in 2020. Patient required assistance for hypoglycemia:  Patient has required hospitalization within the last 1 year from hyper or hypoglycemia:   In terms of diet, the patient ***   HOME DIABETES REGIMEN: Basal: ***  Bolus: ***   Statin: {Yes/No:11203} ACE-I/ARB: {YES/NO:17245} Prior Diabetic Education: {Yes/No:11203}   METER DOWNLOAD SUMMARY: Date range evaluated: *** Fingerstick Blood Glucose Tests = *** Average Number Tests/Day = *** Overall Mean FS Glucose = *** Standard Deviation =   BG Ranges: Low =  High = ***   Hypoglycemic Events/30 Days: BG < 50 =  Episodes of symptomatic severe hypoglycemia = ***   DIABETIC COMPLICATIONS: Microvascular complications:  Retinopathy Denies: *** Last eye exam: Completed   Macrovascular complications:   Denies: CAD, PVD, CVA   PAST HISTORY: Past Medical History:  Past Medical History:  Diagnosis Date   Diabetes mellitus without complication (HCC)    Hypertension    Iron deficiency anemia, unspecified 07/10/2012   Type II or unspecified type diabetes mellitus without mention of complication, not stated as  uncontrolled 07/10/2012   Past Surgical History:  Past Surgical History:  Procedure Laterality Date   BACK SURGERY     CESAREAN SECTION     CHOLECYSTECTOMY     TUBAL LIGATION      Social History:  reports that she has never smoked. She has never used smokeless tobacco. She reports current alcohol use. She reports that she does not use drugs. Family History: No family history on file.   HOME MEDICATIONS: Allergies as of 07/28/2020       Reactions   Iodine Itching   Lisinopril Cough, Nausea And Vomiting   Saxagliptin-metformin Er Diarrhea   Lansoprazole Nausea Only, Rash   Latex Rash   Sulfamethoxazole-trimethoprim Rash        Medication List        Accurate as of July 28, 2020  9:57 AM. If you have any questions, ask your nurse or doctor.          amLODipine 10 MG tablet Commonly known as: NORVASC Take 1 tablet by mouth daily.   B-D ULTRAFINE III SHORT PEN 31G X 8 MM Misc Generic drug: Insulin Pen Needle Check TID as directed   empagliflozin 25 MG Tabs tablet Commonly known as: JARDIANCE Take 1 tablet by mouth daily.   insulin lispro 100 UNIT/ML injection Commonly known as: HUMALOG Inject into the skin 3 (three) times daily before meals.   losartan-hydrochlorothiazide 100-25 MG tablet Commonly known as: HYZAAR Take 1  tablet by mouth daily.   meloxicam 7.5 MG tablet Commonly known as: MOBIC Take 1 tablet by mouth daily.   Ozempic (1 MG/DOSE) 4 MG/3ML Sopn Generic drug: Semaglutide (1 MG/DOSE) INJECT 0.75 MLS UNDER THE SKIN ONCE WEEKLY   pioglitazone 30 MG tablet Commonly known as: ACTOS TAKE ONE TABLET BY MOUTH DAILY. LAST REFILL UNTIL PATIENT IS SEEN IN CLINIC   Precision QID Test test strip Generic drug: glucose blood Monitor BG three to four times daily, before meals and bedtime   Tresiba FlexTouch 200 UNIT/ML FlexTouch Pen Generic drug: insulin degludec Inject into the skin.   VITAMIN B 12 PO Take by mouth every morning.          ALLERGIES: Allergies  Allergen Reactions   Iodine Itching   Lisinopril Cough and Nausea And Vomiting   Saxagliptin-Metformin Er Diarrhea   Lansoprazole Nausea Only and Rash   Latex Rash   Sulfamethoxazole-Trimethoprim Rash     REVIEW OF SYSTEMS: A comprehensive ROS was conducted with the patient and is negative except as per HPI and below:  ROS    OBJECTIVE:   VITAL SIGNS: There were no vitals taken for this visit.   PHYSICAL EXAM:  General: Pt appears well and is in NAD  Hydration: Well-hydrated with moist mucous membranes and good skin turgor  HEENT: Head: Unremarkable with good dentition. Oropharynx clear without exudate.  Eyes: External eye exam normal without stare, lid lag or exophthalmos.  EOM intact.  PERRL.  Neck: General: Supple without adenopathy or carotid bruits. Thyroid: Thyroid size normal.  No goiter or nodules appreciated. No thyroid bruit.  Lungs: Clear with good BS bilat with no rales, rhonchi, or wheezes  Heart: RRR with normal S1 and S2 and no gallops; no murmurs; no rub  Abdomen: Normoactive bowel sounds, soft, nontender, without masses or organomegaly palpable  Extremities:  Lower extremities - No pretibial edema. No lesions.  Skin: Normal texture and temperature to palpation. No rash noted. No Acanthosis nigricans/skin tags. No lipohypertrophy.  Neuro: MS is good with appropriate affect, pt is alert and Ox3    DM foot exam:    DATA REVIEWED:  Lab Results  Component Value Date   HGBA1C 7.7 (H) 06/19/2020   Lab Results  Component Value Date   CREATININE 1.30 (H) 06/19/2020   No results found for: MICRALBCREAT  No results found for: CHOL, HDL, LDLCALC, LDLDIRECT, TRIG, CHOLHDL      ASSESSMENT / PLAN / RECOMMENDATIONS:   1) Type 2 Diabetes Mellitus, ***controlled, With*** complications - Most recent A1c of *** %. Goal A1c < *** %.  ***  Plan: GENERAL:   MEDICATIONS:   EDUCATION / INSTRUCTIONS: BG monitoring instructions:  Patient is instructed to check her blood sugars *** times a day, ***. Call [facility] if: BG persistently < 70 or > 300. I reviewed the Rule of 15 for the treatment of hypoglycemia in detail with the patient. Literature supplied.   2) Diabetic complications:  Eye: Does *** have known diabetic retinopathy.  Neuro/ Feet: Does *** have known diabetic peripheral neuropathy. Renal: Patient does *** have known baseline CKD. She is *** on an ACEI/ARB at present.Check urine albumin/creatinine ratio yearly starting at time of diagnosis. If albuminuria is positive, treatment is geared toward better glucose, blood pressure control and use of ACE inhibitors or ARBs. Monitor electrolytes and creatinine once to twice yearly.   3) Lipids: Patient is *** on a statin.    4) Hypertension: ***  at goal  of < 140/90 mmHg.       Signed electronically by: Lyndle Herrlich, MD  [facility] Endocrinology  [facility] [address] [place], [place] [address] Phone: [phone] FAX: [phone]   CC: Olive Bass, FNP [address] [place] [address] Phone: [phone]  Fax: [phone]    Return to Endocrinology clinic as below: Future Appointments  Date Time Provider Department Center  07/28/2020  3:20 PM Saleem Coccia, Konrad Dolores, MD [facility]  08/18/2020  2:00 PM [facility]  09/29/2020  3:20 PM Olive Bass, FNP [facility]

## 2020-08-04 ENCOUNTER — Ambulatory Visit: Payer: 59 | Admitting: Internal Medicine

## 2020-08-11 ENCOUNTER — Other Ambulatory Visit: Payer: Self-pay

## 2020-08-11 ENCOUNTER — Encounter: Payer: Self-pay | Admitting: Internal Medicine

## 2020-08-11 ENCOUNTER — Ambulatory Visit (INDEPENDENT_AMBULATORY_CARE_PROVIDER_SITE_OTHER): Payer: 59 | Admitting: Internal Medicine

## 2020-08-11 VITALS — BP 132/68 | HR 78 | Ht 61.0 in | Wt 210.0 lb

## 2020-08-11 DIAGNOSIS — E1165 Type 2 diabetes mellitus with hyperglycemia: Secondary | ICD-10-CM

## 2020-08-11 DIAGNOSIS — Z794 Long term (current) use of insulin: Secondary | ICD-10-CM

## 2020-08-11 LAB — BASIC METABOLIC PANEL
BUN: 12 mg/dL (ref 6–23)
CO2: 29 mEq/L (ref 19–32)
Calcium: 9.4 mg/dL (ref 8.4–10.5)
Chloride: 103 mEq/L (ref 96–112)
Creatinine, Ser: 0.9 mg/dL (ref 0.40–1.20)
GFR: 72.41 mL/min (ref >60.00)
Glucose, Bld: 65 mg/dL — ABNORMAL LOW (ref 70–99)
Potassium: 4 mEq/L (ref 3.5–5.1)
Sodium: 140 mEq/L (ref 135–145)

## 2020-08-11 LAB — MICROALBUMIN / CREATININE URINE RATIO
Creatinine,U: 96.9 mg/dL
Microalb Creat Ratio: 30.8 mg/g — ABNORMAL HIGH (ref 0.0–30.0)
Microalb, Ur: 29.8 mg/dL — ABNORMAL HIGH (ref 0.0–1.9)

## 2020-08-11 LAB — POCT GLUCOSE (DEVICE FOR HOME USE)
Glucose Fasting, POC: 76 mg/dL (ref 70–99)
Glucose Fasting, POC: 84 mg/dL (ref 70–99)

## 2020-08-11 LAB — LIPID PANEL
Cholesterol: 104 mg/dL (ref 0–200)
HDL: 31.8 mg/dL — ABNORMAL LOW (ref >39.00)
LDL Cholesterol: 62 mg/dL (ref 0–99)
NonHDL: 72.45
Total CHOL/HDL Ratio: 3
Triglycerides: 51 mg/dL (ref 0.0–149.0)
VLDL: 10.2 mg/dL (ref 0.0–40.0)

## 2020-08-11 LAB — TSH: TSH: 0.97 u[IU]/mL (ref 0.35–4.50)

## 2020-08-11 MED ORDER — BD PEN NEEDLE SHORT U/F 31G X 8 MM MISC: 1.0000 | Freq: Four times a day (QID) | 3 refills | Status: DC

## 2020-08-11 MED ORDER — TRESIBA FLEXTOUCH 200 UNIT/ML ~~LOC~~ SOPN: 90.0000 [IU] | PEN_INJECTOR | Freq: Every day | SUBCUTANEOUS | 3 refills | Status: DC

## 2020-08-11 MED ORDER — OZEMPIC (2 MG/DOSE) 8 MG/3ML ~~LOC~~ SOPN: 2.0000 mg | PEN_INJECTOR | SUBCUTANEOUS | 3 refills | Status: DC

## 2020-08-11 MED ORDER — DEXCOM G6 RECEIVER DEVI: 1.0000 | 0 refills | Status: DC

## 2020-08-11 MED ORDER — PIOGLITAZONE HCL 30 MG PO TABS: 30.0000 mg | ORAL_TABLET | Freq: Every day | ORAL | 3 refills | Status: DC

## 2020-08-11 MED ORDER — EMPAGLIFLOZIN 25 MG PO TABS: 25.0000 mg | ORAL_TABLET | Freq: Every day | ORAL | 2 refills | Status: DC

## 2020-08-11 MED ORDER — INSULIN LISPRO (1 UNIT DIAL) 100 UNIT/ML (KWIKPEN): PEN_INJECTOR | SUBCUTANEOUS | 3 refills | Status: DC

## 2020-08-11 MED ORDER — DEXCOM G6 TRANSMITTER MISC: 1.0000 | 3 refills | Status: DC

## 2020-08-11 MED ORDER — DEXCOM G6 SENSOR MISC: 1.0000 | 3 refills | Status: DC

## 2020-08-11 NOTE — Progress Notes (Signed)
Name: Michelle Mercer  MRN/ DOB: [id number], Jun 04, 1965   Age/ Sex: 55 y.o., female    PCP: Michelle Bass, FNP   Reason for Endocrinology Evaluation: Type 2 Diabetes Mellitus     Date of Initial Endocrinology Visit: 08/11/2020     PATIENT IDENTIFIER: Michelle Mercer is a 55 y.o. female with a past medical history of T2DM, Dyslipidemia and HTN . The patient presented for initial endocrinology clinic visit on 08/11/2020 for consultative assistance with her diabetes management.    HPI: Michelle Mercer was    Diagnosed with DM in her  early 13's  Prior Medications tried/Intolerance: Kombiglyze- diarrhea , metformin - diarrhea  Currently checking blood sugars 1 x / week  Hypoglycemia episodes : seldom               Hemoglobin A1c has ranged from 7.1% in 2021, peaking at 15.0% in 2020. Patient required assistance for hypoglycemia: no  Patient has required hospitalization within the last 1 year from hyper or hypoglycemia: no  In terms of diet, the patient eats 3 meals a day and snacks once a day  Avoids sugar- sweetened beverages   Works at daycare   HOME DIABETES REGIMEN: Jardiance 25 mg daily - not sure  Tresiba 100 units daily  Humalog SS Pioglitazone 30 daily  Ozempic 1 mg weekly ( Monday)    Statin: no  ACE-I/ARB: yes Prior Diabetic Education: yes   METER DOWNLOAD SUMMARY: Does not check    DIABETIC COMPLICATIONS: Microvascular complications:  Hx of questionable Retinopathy Denies: CKD ,  neuropathy  Last eye exam: Completed > 1 yr ago   Macrovascular complications:   Denies: CAD, PVD, CVA   PAST HISTORY: Past Medical History:  Past Medical History:  Diagnosis Date   Diabetes mellitus without complication (HCC)    Hypertension    Iron deficiency anemia, unspecified 07/10/2012   Type II or unspecified type diabetes mellitus without mention of complication, not stated as uncontrolled 07/10/2012   Past Surgical History:  Past Surgical History:   Procedure Laterality Date   BACK SURGERY     CESAREAN SECTION     CHOLECYSTECTOMY     TUBAL LIGATION      Social History:  reports that she has never smoked. She has never used smokeless tobacco. She reports current alcohol use. She reports that she does not use drugs. Family History:  Family History  Problem Relation Age of Onset   Diabetes Mother    Hypertension Father    Heart disease Father    Diabetes Father      HOME MEDICATIONS: Allergies as of 08/11/2020       Reactions   Iodine Itching   Lisinopril Cough, Nausea And Vomiting   Saxagliptin-metformin Er Diarrhea   Lansoprazole Nausea Only, Rash   Latex Rash   Sulfamethoxazole-trimethoprim Rash        Medication List        Accurate as of August 11, 2020  8:11 AM. If you have any questions, ask your nurse or doctor.          amLODipine 10 MG tablet Commonly known as: NORVASC Take 1 tablet by mouth daily.   B-D ULTRAFINE III SHORT PEN 31G X 8 MM Misc Generic drug: Insulin Pen Needle Check TID as directed   empagliflozin 25 MG Tabs tablet Commonly known as: JARDIANCE Take 1 tablet by mouth daily.   insulin lispro 100 UNIT/ML injection Commonly known as: HUMALOG Inject into the skin 3 (three) times  daily before meals.   losartan-hydrochlorothiazide 100-25 MG tablet Commonly known as: HYZAAR Take 1 tablet by mouth daily.   meloxicam 7.5 MG tablet Commonly known as: MOBIC Take 1 tablet by mouth daily.   Ozempic (1 MG/DOSE) 4 MG/3ML Sopn Generic drug: Semaglutide (1 MG/DOSE) INJECT 0.75 MLS UNDER THE SKIN ONCE WEEKLY   pioglitazone 30 MG tablet Commonly known as: ACTOS TAKE ONE TABLET BY MOUTH DAILY. LAST REFILL UNTIL PATIENT IS SEEN IN CLINIC   Precision QID Test test strip Generic drug: glucose blood Monitor BG three to four times daily, before meals and bedtime   Tresiba FlexTouch 200 UNIT/ML FlexTouch Pen Generic drug: insulin degludec Inject into the skin.   VITAMIN B 12 PO Take by  mouth every morning.         ALLERGIES: Allergies  Allergen Reactions   Iodine Itching   Lisinopril Cough and Nausea And Vomiting   Saxagliptin-Metformin Er Diarrhea   Lansoprazole Nausea Only and Rash   Latex Rash   Sulfamethoxazole-Trimethoprim Rash     REVIEW OF SYSTEMS: A comprehensive ROS was conducted with the patient and is negative except as per HPI and below:  ROS    OBJECTIVE:   VITAL SIGNS: BP 132/68   Pulse 78   Ht 5\' 1"  (1.549 m)   Wt 210 lb (95.3 kg)   SpO2 98%   BMI 39.68 kg/m    PHYSICAL EXAM:  General: Pt appears well and is in NAD  Neck: General: Supple without adenopathy or carotid bruits. Thyroid: Thyroid size normal.  No goiter or nodules appreciated.   Lungs: Clear with good BS bilat with no rales, rhonchi, or wheezes  Heart: RRR with normal S1 and S2 and no gallops; no murmurs; no rub  Abdomen: Normoactive bowel sounds, soft, nontender, without masses or organomegaly palpable  Extremities:  Lower extremities - No pretibial edema. No lesions.  Skin: Normal texture and temperature to palpation. No rash noted.   Neuro: MS is good with appropriate affect, pt is alert and Ox3    DATA REVIEWED:  Lab Results  Component Value Date   HGBA1C 7.7 (H) 06/19/2020   Lab Results  Component Value Date   CREATININE 1.30 (H) 06/19/2020   Results for RANIE, CHINCHILLA (MRN Silvestre Gunner) as of 08/11/2020 16:16  Ref. Range 08/11/2020 08:44  Sodium Latest Ref Range: 135 - 145 mEq/L 140  Potassium Latest Ref Range: 3.5 - 5.1 mEq/L 4.0  Chloride Latest Ref Range: 96 - 112 mEq/L 103  CO2 Latest Ref Range: 19 - 32 mEq/L 29  Glucose Latest Ref Range: 70 - 99 mg/dL 65 (L)  BUN Latest Ref Range: 6 - 23 mg/dL 12  Creatinine Latest Ref Range: 0.40 - 1.20 mg/dL 08/13/2020  Calcium Latest Ref Range: 8.4 - 10.5 mg/dL 9.4  GFR Latest Ref Range: >60.00 mL/min 72.41  Total CHOL/HDL Ratio Unknown 3  Cholesterol Latest Ref Range: 0 - 200 mg/dL 0.92  HDL Cholesterol Latest Ref  Range: >39.00 mg/dL 330 (L)  LDL (calc) Latest Ref Range: 0 - 99 mg/dL 62  MICROALB/CREAT RATIO Latest Ref Range: 0.0 - 30.0 mg/g 30.8 (H)  NonHDL Unknown 72.45  Triglycerides Latest Ref Range: 0.0 - 149.0 mg/dL 07.62  VLDL Latest Ref Range: 0.0 - 40.0 mg/dL 26.3  TSH Latest Ref Range: 0.35 - 4.50 uIU/mL 0.97  Creatinine,U Latest Units: mg/dL 33.5  Microalb, Ur Latest Ref Range: 0.0 - 1.9 mg/dL 45.6 (H)    ASSESSMENT / PLAN / RECOMMENDATIONS:   1) Type  2 Diabetes Mellitus, Sub-Optimally controlled, With microalbuminuria  - Most recent A1c of 7.7 %. Goal A1c < 7.0 %.    I have discussed with the patient the pathophysiology of diabetes. We went over the natural progression of the disease. We talked about both insulin resistance and insulin deficiency. We stressed the importance of lifestyle changes including diet and exercise. I explained the complications associated with diabetes including retinopathy, nephropathy, neuropathy as well as increased risk of cardiovascular disease. We went over the benefit seen with glycemic control.   I explained to the patient that diabetic patients are at higher than normal risk for amputations.  Discussed pharmacokinetics of basal/bolus insulin and the importance of taking prandial insulin with meals.  We also discussed avoiding sugar-sweetened beverages and snacks, when possible.  - Discussed the importance of glucose checks, she is missing a lot of daily opportunities to take Humalog because she doesn;t check , hence does not use humalog  - She is not sure if she is taking Jardiance, will refill - Dexcom prescribed  - In office BG 76 mg/dL ( Fasting 90, she was given 2 sips of apple juice, ) repeat 84 mg/dL . Will decrease basal insulin as below      MEDICATIONS: Continue Jardiance 25 mg, 1 tablet every morning  Increase Ozempic to 2 mg weekly  Decrease Tresiba 90 units once daily  Continue Pioglitazone 30 mg daily   EDUCATION / INSTRUCTIONS: BG  monitoring instructions: Patient is instructed to check her blood sugars 4 times a day, before meals and bedtime . Call [facility] if: BG persistently < 70  I reviewed the Rule of 15 for the treatment of hypoglycemia in detail with the patient. Literature supplied.   2) Diabetic complications:  Eye: There's a questionable history of  diabetic retinopathy. Pt urged to have an exam  Neuro/ Feet: Does not have known diabetic peripheral neuropathy. Renal: Patient does not have known baseline CKD. She is  on an ACEI/ARB at present.Check urine albumin/creatinine ratio yearly starting at time of diagnosis.    3) Lipids: Patient is not on a statin. LDL at goal and there's no indication for statin therapy at this time     F/U in 3 months      Signed electronically by: Lyndle Herrlich, MD  [facility]  [facility] [address] [place], [place] [address] Phone: [phone] FAX: [phone]   CC: Michelle Bass, FNP [address] [place] [address] Phone: [phone]  Fax: [phone]    Return to Endocrinology clinic as below: Future Appointments  Date Time Provider Department Center  08/18/2020  2:00 PM [facility]  09/29/2020  3:20 PM Michelle Bass, FNP [facility]

## 2020-08-11 NOTE — Patient Instructions (Signed)
-   Continue Jardiance 25 mg, 1 tablet every morning  - Increase Ozempic to 2 mg weekly  - Decrease Tresiba 90 units once daily  - Humalog correctional insulin: ADD extra units on insulin to your meal-time Humalog dose if your blood sugars are higher than 150. Use the scale below to help guide you:   Blood sugar before meal Number of units to inject  Less than 150 0 unit  151 -  170 1 units  171 -  190 2 units  191 -  210 3 units  211 -  230 4 units  231 -  250 5 units  251 -  270 6 units  271 -  290 7 units  291 -  310 8 units  311 - 330 9 units    HOW TO TREAT LOW BLOOD SUGARS (Blood sugar LESS THAN 70 MG/DL) Please follow the RULE OF 15 for the treatment of hypoglycemia treatment (when your (blood sugars are less than 70 mg/dL)   STEP 1: Take 15 grams of carbohydrates when your blood sugar is low, which includes:  3-4 GLUCOSE TABS  OR 3-4 OZ OF JUICE OR REGULAR SODA OR ONE TUBE OF GLUCOSE GEL    STEP 2: RECHECK blood sugar in 15 MINUTES STEP 3: If your blood sugar is still low at the 15 minute recheck --> then, go back to STEP 1 and treat AGAIN with another 15 grams of carbohydrates.

## 2020-08-18 ENCOUNTER — Encounter (HOSPITAL_BASED_OUTPATIENT_CLINIC_OR_DEPARTMENT_OTHER): Payer: Self-pay

## 2020-08-18 ENCOUNTER — Inpatient Hospital Stay (HOSPITAL_BASED_OUTPATIENT_CLINIC_OR_DEPARTMENT_OTHER): Admission: RE | Admit: 2020-08-18 | Payer: 59 | Source: Ambulatory Visit

## 2020-08-19 ENCOUNTER — Telehealth: Payer: Self-pay | Admitting: Pharmacy Technician

## 2020-08-19 ENCOUNTER — Telehealth: Payer: Self-pay

## 2020-08-19 DIAGNOSIS — G8929 Other chronic pain: Secondary | ICD-10-CM

## 2020-08-19 DIAGNOSIS — I10 Essential (primary) hypertension: Secondary | ICD-10-CM

## 2020-08-19 MED ORDER — ASPIRIN 81 MG PO TBEC: 81.0000 mg | DELAYED_RELEASE_TABLET | Freq: Every day | ORAL | 1 refills | Status: DC

## 2020-08-19 MED ORDER — MELOXICAM 7.5 MG PO TABS: 7.5000 mg | ORAL_TABLET | Freq: Every day | ORAL | 0 refills | Status: DC

## 2020-08-19 NOTE — Telephone Encounter (Addendum)
Patient Advocate Encounter   Received notification from [employer] that prior authorization for DEXCOM G6 SENSOR AND TRANSMITTER is required.   PA submitted on 08/19/2020 Keys: [facility] AND [id number] Status is APPROVED 08/20/2020 - 08/20/2021    [facility] will continue to follow.   Netty Starring. Dimas Aguas, CPhT Patient Advocate [facility] Phone: [phone] Fax:  [phone]

## 2020-08-19 NOTE — Telephone Encounter (Signed)
Requesting med refill of Asprin 81 mg and Meloxicam. Rx sent to pharmacy.

## 2020-08-24 ENCOUNTER — Telehealth: Payer: Self-pay | Admitting: Internal Medicine

## 2020-08-24 NOTE — Telephone Encounter (Signed)
[facility] Is requesting a script for Continuous Blood Gluc Sensor (DEXCOM G6 SENSOR) MISC  to be sent to them.    And they to kow the frequncey request ed for patient to take   [facility] [phone] - [place], [place] - [address]

## 2020-08-25 ENCOUNTER — Other Ambulatory Visit: Payer: Self-pay

## 2020-09-04 ENCOUNTER — Other Ambulatory Visit: Payer: Self-pay | Admitting: Internal Medicine

## 2020-09-04 MED ORDER — DEXCOM G6 TRANSMITTER MISC: 1.0000 | 3 refills | Status: DC

## 2020-09-04 MED ORDER — DEXCOM G6 RECEIVER DEVI: 1.0000 | 0 refills | Status: DC

## 2020-09-04 MED ORDER — DEXCOM G6 SENSOR MISC: 1.0000 | 3 refills | Status: DC

## 2020-09-18 ENCOUNTER — Telehealth: Payer: Self-pay | Admitting: *Deleted

## 2020-09-18 NOTE — Telephone Encounter (Signed)
CGM at [facility] and is like $150.00. Patient would like to know if there are there and cost lowering options?

## 2020-09-24 ENCOUNTER — Other Ambulatory Visit (HOSPITAL_COMMUNITY): Payer: Self-pay

## 2020-09-24 NOTE — Telephone Encounter (Signed)
[facility] Patient Advocate Encounter  Tried calling patient in response to cost lowering question.  There was no answer and no voicemail option.  Patient was prescribed Dexcom G6 products.  Her costs are around $150.  I wished to ask her which of the products were included in that cost (sensors, transmitter and receiver?).  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  Provider messaged.    Netty Starring. Dimas Aguas CPhT Specialty Pharmacy Patient [facility] Phone: [phone] Fax:  [phone]

## 2020-09-28 ENCOUNTER — Encounter: Payer: Self-pay | Admitting: Pharmacy Technician

## 2020-09-28 ENCOUNTER — Encounter: Payer: Self-pay | Admitting: Family

## 2020-09-29 ENCOUNTER — Encounter (HOSPITAL_BASED_OUTPATIENT_CLINIC_OR_DEPARTMENT_OTHER): Payer: Self-pay

## 2020-09-29 ENCOUNTER — Ambulatory Visit (HOSPITAL_BASED_OUTPATIENT_CLINIC_OR_DEPARTMENT_OTHER)
Admission: RE | Admit: 2020-09-29 | Discharge: 2020-09-29 | Disposition: A | Discharge status: Home / Self Care | Payer: 59 | Source: Ambulatory Visit | Attending: Family | Admitting: Family

## 2020-09-29 ENCOUNTER — Other Ambulatory Visit: Payer: Self-pay

## 2020-09-29 ENCOUNTER — Other Ambulatory Visit: Payer: Self-pay | Admitting: Family

## 2020-09-29 ENCOUNTER — Ambulatory Visit (INDEPENDENT_AMBULATORY_CARE_PROVIDER_SITE_OTHER): Payer: 59 | Admitting: Family

## 2020-09-29 ENCOUNTER — Encounter: Payer: Self-pay | Admitting: Family

## 2020-09-29 VITALS — BP 90/60 | HR 67 | Temp 98.0°F | Ht 61.0 in | Wt 202.6 lb

## 2020-09-29 DIAGNOSIS — G8929 Other chronic pain: Secondary | ICD-10-CM

## 2020-09-29 DIAGNOSIS — M25512 Pain in left shoulder: Secondary | ICD-10-CM

## 2020-09-29 DIAGNOSIS — I10 Essential (primary) hypertension: Secondary | ICD-10-CM

## 2020-09-29 DIAGNOSIS — M5442 Lumbago with sciatica, left side: Secondary | ICD-10-CM

## 2020-09-29 DIAGNOSIS — Z91018 Allergy to other foods: Secondary | ICD-10-CM

## 2020-09-29 DIAGNOSIS — M545 Low back pain, unspecified: Secondary | ICD-10-CM

## 2020-09-29 DIAGNOSIS — E119 Type 2 diabetes mellitus without complications: Secondary | ICD-10-CM

## 2020-09-29 DIAGNOSIS — Z1231 Encounter for screening mammogram for malignant neoplasm of breast: Secondary | ICD-10-CM | POA: Insufficient documentation

## 2020-09-29 DIAGNOSIS — Z794 Long term (current) use of insulin: Secondary | ICD-10-CM

## 2020-09-29 DIAGNOSIS — M5441 Lumbago with sciatica, right side: Secondary | ICD-10-CM

## 2020-09-29 DIAGNOSIS — E1165 Type 2 diabetes mellitus with hyperglycemia: Secondary | ICD-10-CM

## 2020-09-29 MED ORDER — AMLODIPINE BESYLATE 5 MG PO TABS
5.0000 mg | ORAL_TABLET | Freq: Every day | ORAL | 1 refills | Status: DC
Start: 2020-09-29 — End: 2021-04-12

## 2020-09-29 MED ORDER — MELOXICAM 15 MG PO TABS: 15.0000 mg | ORAL_TABLET | Freq: Every day | ORAL | 1 refills | Status: DC

## 2020-09-29 NOTE — Telephone Encounter (Signed)
Concern was addressed at visit today

## 2020-09-29 NOTE — Progress Notes (Signed)
Contessa Preuss is a 55 y.o. female with the following history as recorded in EpicCare:  Patient Active Problem List   Diagnosis Date Noted   Iron deficiency anemia, unspecified 07/10/2012   Type II or unspecified type diabetes mellitus without mention of complication, not stated as uncontrolled 07/10/2012    Current Outpatient Medications  Medication Sig Dispense Refill   aspirin 81 MG EC tablet Take 1 tablet (81 mg total) by mouth daily. 90 tablet 1   Continuous Blood Gluc Receiver (DEXCOM G6 RECEIVER) DEVI 1 Device by Does not apply route as directed. Change every 90 days 1 each 0   Continuous Blood Gluc Sensor (DEXCOM G6 SENSOR) MISC 1 Device by Does not apply route as directed. Change every 10 days 9 each 3   Continuous Blood Gluc Transmit (DEXCOM G6 TRANSMITTER) MISC 1 Device by Does not apply route as directed. 1 each 3   Cyanocobalamin (VITAMIN B 12 PO) Take by mouth every morning.     empagliflozin (JARDIANCE) 25 MG TABS tablet Take 1 tablet (25 mg total) by mouth daily. 90 tablet 2   glucose blood (PRECISION QID TEST) test strip Monitor BG three to four times daily, before meals and bedtime     insulin degludec (TRESIBA FLEXTOUCH) 200 UNIT/ML FlexTouch Pen Inject 90 Units into the skin daily in the afternoon. 45 mL 3   insulin lispro (HUMALOG KWIKPEN) 100 UNIT/ML KwikPen Max daily 30 units 30 mL 3   Insulin Pen Needle (B-D ULTRAFINE III SHORT PEN) 31G X 8 MM MISC Inject 1 Device into the skin in the morning, at noon, in the evening, and at bedtime. 400 each 3   losartan-hydrochlorothiazide (HYZAAR) 100-25 MG tablet Take 1 tablet by mouth daily.     pioglitazone (ACTOS) 30 MG tablet Take 1 tablet (30 mg total) by mouth daily. 90 tablet 3   Semaglutide, 2 MG/DOSE, (OZEMPIC, 2 MG/DOSE,) 8 MG/3ML SOPN Inject 2 mg into the skin once a week. 9 mL 3   amLODipine (NORVASC) 5 MG tablet Take 1 tablet (5 mg total) by mouth daily. 90 tablet 1   meloxicam (MOBIC) 15 MG tablet Take 1 tablet (15 mg  total) by mouth daily. 30 tablet 1   No current facility-administered medications for this visit.    Allergies: Iodine, Lisinopril, Saxagliptin-metformin er, Lansoprazole, Latex, and Sulfamethoxazole-trimethoprim  Past Medical History:  Diagnosis Date   Diabetes mellitus without complication (HCC)    Hypertension    Iron deficiency anemia, unspecified 07/10/2012   Type II or unspecified type diabetes mellitus without mention of complication, not stated as uncontrolled 07/10/2012    Past Surgical History:  Procedure Laterality Date   BACK SURGERY     CESAREAN SECTION     CHOLECYSTECTOMY     TUBAL LIGATION      Family History  Problem Relation Age of Onset   Diabetes Mother    Hypertension Father    Heart disease Father    Diabetes Father     Social History   Tobacco Use   Smoking status: Never   Smokeless tobacco: Never  Substance Use Topics   Alcohol use: Yes    Comment: occasional    Subjective:  Follow up on hypertension; also requesting referral to evaluate for possible food allergies; Has been having dizziness when changing positions recently but no chest pain or shortness of breath;       Objective:  Vitals:   09/29/20 1514  BP: 90/60  Pulse: 67  Temp: 98 F (  36.7 C)  TempSrc: Oral  SpO2: 99%  Weight: 202 lb 9.6 oz (91.9 kg)  Height: 5\' 1"  (1.549 m)    General: Well developed, well nourished, in no acute distress  Skin : Warm and dry.  Head: Normocephalic and atraumatic  Eyes: Sclera and conjunctiva clear; pupils round and reactive to light; extraocular movements intact  Ears: External normal; canals clear; tympanic membranes normal  Oropharynx: Pink, supple. No suspicious lesions  Neck: Supple without thyromegaly, adenopathy  Lungs: Respirations unlabored; clear to auscultation bilaterally without wheeze, rales, rhonchi  CVS exam: normal rate and regular rhythm.  Neurologic: Alert and oriented; speech intact; face symmetrical; moves all extremities  well; CNII-XII intact without focal deficit   Assessment:  1. Primary hypertension   2. Chronic left shoulder pain   3. Chronic bilateral low back pain with bilateral sciatica   4. Type 2 diabetes mellitus without complication, with long-term current use of insulin (HCC)   5. Multiple food allergies     Plan:   Concern for hypotension; cut Amlodipine to 5 mg; she will call back if symptoms persist and will stop this medication; stay on Losartan HCT for now; Refer to ortho in [place] Refer to ortho in [place]; refill updated on Mobic; Continue with endocrinololoigst; Refer to allergist per patient request;  This visit occurred during the SARS-CoV-2 public health emergency.  Safety protocols were in place, including screening questions prior to the visit, additional usage of staff PPE, and extensive cleaning of exam room while observing appropriate contact time as indicated for disinfecting solutions.     No follow-ups on file.  No orders of the defined types were placed in this encounter.   Requested Prescriptions    No prescriptions requested or ordered in this encounter

## 2020-09-29 NOTE — Patient Instructions (Signed)
If your dizzy symptoms are persisting in the next 1-2 weeks after we change your blood pressure medication, please let me know and we will stop the Amlodipine completely. We will see what your blood pressure looks like when you see Dr. Lonzo Cloud in early October.

## 2020-10-12 ENCOUNTER — Telehealth: Payer: 59 | Admitting: Family

## 2020-10-12 DIAGNOSIS — H109 Unspecified conjunctivitis: Secondary | ICD-10-CM | POA: Diagnosis not present

## 2020-10-12 MED ORDER — ERYTHROMYCIN 5 MG/GM OP OINT: 1.0000 "application " | TOPICAL_OINTMENT | Freq: Four times a day (QID) | OPHTHALMIC | 0 refills | Status: AC

## 2020-10-12 NOTE — Progress Notes (Signed)
Virtual Visit Consent   Michelle Mercer, you are scheduled for a virtual visit with a [facility] provider today.     Just as with appointments in the office, your consent must be obtained to participate.  Your consent will be active for this visit and any virtual visit you may have with one of our providers in the next 365 days.     If you have a MyChart account, a copy of this consent can be sent to you electronically.  All virtual visits are billed to your insurance company just like a traditional visit in the office.    As this is a virtual visit, video technology does not allow for your provider to perform a traditional examination.  This may limit your provider's ability to fully assess your condition.  If your provider identifies any concerns that need to be evaluated in person or the need to arrange testing (such as labs, EKG, etc.), we will make arrangements to do so.     Although advances in technology are sophisticated, we cannot ensure that it will always work on either your end or our end.  If the connection with a video visit is poor, the visit may have to be switched to a telephone visit.  With either a video or telephone visit, we are not always able to ensure that we have a secure connection.     I need to obtain your verbal consent now.   Are you willing to proceed with your visit today?    Michelle Mercer has provided verbal consent on 10/12/2020 for a virtual visit (video or telephone).   Jannifer Rodney, FNP   Date: 10/12/2020 7:16 PM   Virtual Visit via Video Note   I, Jannifer Rodney, connected with  Michelle Mercer  ([id number], [birth year]) on 10/12/20 at  7:15 PM EDT by a video-enabled telemedicine application and verified that I am speaking with the correct person using two identifiers.  Location: Patient: Virtual Visit Location Patient: Home Provider: Virtual Visit Location Provider: Home   I discussed the limitations of evaluation and management by telemedicine and the  availability of in person appointments. The patient expressed understanding and agreed to proceed.    History of Present Illness: Michelle Mercer is a 55 y.o. who identifies as a female who was assigned female at birth, and is being seen today for pink eye.  HPI: Conjunctivitis  The current episode started yesterday. The onset was sudden. The problem occurs continuously. The problem is mild. Associated symptoms include eye itching, photophobia, eye discharge and eye redness. Pertinent negatives include no decreased vision, no double vision, no ear discharge, no ear pain, no headaches and no eye pain. The right eye is affected.   Problems:  Patient Active Problem List   Diagnosis Date Noted   Iron deficiency anemia, unspecified 07/10/2012   Type II or unspecified type diabetes mellitus without mention of complication, not stated as uncontrolled 07/10/2012    Allergies:  Allergies  Allergen Reactions   Iodine Itching   Lisinopril Cough and Nausea And Vomiting   Saxagliptin-Metformin Er Diarrhea   Lansoprazole Nausea Only and Rash   Latex Rash   Sulfamethoxazole-Trimethoprim Rash   Medications:  Current Outpatient Medications:    erythromycin ophthalmic ointment, Place 1 application into the right eye 4 (four) times daily for 7 days., Disp: 28 g, Rfl: 0   amLODipine (NORVASC) 5 MG tablet, Take 1 tablet (5 mg total) by mouth daily., Disp: 90 tablet, Rfl: 1   aspirin  81 MG EC tablet, Take 1 tablet (81 mg total) by mouth daily., Disp: 90 tablet, Rfl: 1   Continuous Blood Gluc Receiver (DEXCOM G6 RECEIVER) DEVI, 1 Device by Does not apply route as directed. Change every 90 days, Disp: 1 each, Rfl: 0   Continuous Blood Gluc Sensor (DEXCOM G6 SENSOR) MISC, 1 Device by Does not apply route as directed. Change every 10 days, Disp: 9 each, Rfl: 3   Continuous Blood Gluc Transmit (DEXCOM G6 TRANSMITTER) MISC, 1 Device by Does not apply route as directed., Disp: 1 each, Rfl: 3   Cyanocobalamin  (VITAMIN B 12 PO), Take by mouth every morning., Disp: , Rfl:    empagliflozin (JARDIANCE) 25 MG TABS tablet, Take 1 tablet (25 mg total) by mouth daily., Disp: 90 tablet, Rfl: 2   glucose blood (PRECISION QID TEST) test strip, Monitor BG three to four times daily, before meals and bedtime, Disp: , Rfl:    insulin degludec (TRESIBA FLEXTOUCH) 200 UNIT/ML FlexTouch Pen, Inject 90 Units into the skin daily in the afternoon., Disp: 45 mL, Rfl: 3   insulin lispro (HUMALOG KWIKPEN) 100 UNIT/ML KwikPen, Max daily 30 units, Disp: 30 mL, Rfl: 3   Insulin Pen Needle (B-D ULTRAFINE III SHORT PEN) 31G X 8 MM MISC, Inject 1 Device into the skin in the morning, at noon, in the evening, and at bedtime., Disp: 400 each, Rfl: 3   losartan-hydrochlorothiazide (HYZAAR) 100-25 MG tablet, Take 1 tablet by mouth daily., Disp: , Rfl:    meloxicam (MOBIC) 15 MG tablet, Take 1 tablet (15 mg total) by mouth daily., Disp: 30 tablet, Rfl: 1   pioglitazone (ACTOS) 30 MG tablet, Take 1 tablet (30 mg total) by mouth daily., Disp: 90 tablet, Rfl: 3   Semaglutide, 2 MG/DOSE, (OZEMPIC, 2 MG/DOSE,) 8 MG/3ML SOPN, Inject 2 mg into the skin once a week., Disp: 9 mL, Rfl: 3  Observations/Objective: Patient is well-developed, well-nourished in no acute distress.  Resting comfortably  at home.  Head is normocephalic, atraumatic.  No labored breathing.  Speech is clear and coherent with logical content.  Patient is alert and oriented at baseline.  Right eye erythemas  Assessment and Plan: 1. Bacterial conjunctivitis - erythromycin ophthalmic ointment; Place 1 application into the right eye 4 (four) times daily for 7 days.  Dispense: 28 g; Refill: 0 Keep clean and dry Good hand hygiene  Warm compresses  Follow up if symptoms worsen or do not improve Work note given  Follow Up Instructions: I discussed the assessment and treatment plan with the patient. The patient was provided an opportunity to ask questions and all were  answered. The patient agreed with the plan and demonstrated an understanding of the instructions.  A copy of instructions were sent to the patient via MyChart.  The patient was advised to call back or seek an in-person evaluation if the symptoms worsen or if the condition fails to improve as anticipated.  Time:  I spent 8 minutes with the patient via telehealth technology discussing the above problems/concerns.    Jannifer Rodney, FNP

## 2020-10-26 ENCOUNTER — Encounter: Payer: Self-pay | Admitting: Internal Medicine

## 2020-10-26 ENCOUNTER — Ambulatory Visit: Payer: 59 | Admitting: Internal Medicine

## 2020-10-26 ENCOUNTER — Other Ambulatory Visit: Payer: Self-pay

## 2020-10-26 VITALS — BP 142/68 | HR 89 | Temp 98.0°F | Resp 17 | Ht 61.0 in | Wt 205.8 lb

## 2020-10-26 DIAGNOSIS — K2960 Other gastritis without bleeding: Secondary | ICD-10-CM | POA: Diagnosis not present

## 2020-10-26 DIAGNOSIS — J310 Chronic rhinitis: Secondary | ICD-10-CM | POA: Diagnosis not present

## 2020-10-26 DIAGNOSIS — R112 Nausea with vomiting, unspecified: Secondary | ICD-10-CM | POA: Diagnosis not present

## 2020-10-26 DIAGNOSIS — K219 Gastro-esophageal reflux disease without esophagitis: Secondary | ICD-10-CM

## 2020-10-26 MED ORDER — PANTOPRAZOLE SODIUM 40 MG PO TBEC: 40.0000 mg | DELAYED_RELEASE_TABLET | Freq: Every day | ORAL | 5 refills | Status: DC

## 2020-10-26 NOTE — Progress Notes (Signed)
NEW PATIENT Date of Service/Encounter:  10/26/20 Referring provider: Marrian Mercer,* Primary care provider: Marrian Salvage, FNP  Subjective:  Michelle Mercer is a 55 y.o. female with a PMHx of iron deficiency anemia, DM2 presenting today for evaluation of concern for food allergy. History obtained from: chart review and patient.  This has been ongoing for 5 years.  She has concerns with specific foods. She is able to eat dairy in her cereal and in a glass of milk (usually 1-2%) and is fine.  However, she is unable to tolerate cheese or baked cheese.  For example, mac & cheese makes her vomit.  Eggs also cause her to vomit. Potatoes cause her to vomit if baked potatoes or whole potatoes, but not french fries. Peaches make her itch specifically around her mouth.   She never feels like she gets food stuck in her throat. She denies ever having seen a GI specialist. On chart review, she does have an EGD and colonoscopy from 2014 in setting of rectal bleeding.  EGD was normal at that time.  She denies having current symptoms at that time. No issues with wheat, soy, peanuts, tree nuts, shellfish, and fish.    She does have some seasonal allergies, notices this with grass cutting.  She gets itchy eyes and nose.  Not bothered enough to take any medications.  She does not have a diagnosis of reflux, but does report some heartburn.  Happens usually about 30 minutes or more after eating lunch, and she treats with Tums.  Feels like this happens more at work.    PCP notes from referral reviewed (09/29/20)-patient requesting allergy eval for concern for multiple food allergies.  Other allergy screening: Asthma: no Medication allergy: yes-lansoprazole gave her nausea Hymenoptera allergy: no Urticaria: no Eczema:no  Past Medical History: Past Medical History:  Diagnosis Date   Diabetes mellitus without complication ([place])    Hypertension    Iron deficiency anemia, unspecified  07/10/2012   Type II or unspecified type diabetes mellitus without mention of complication, not stated as uncontrolled 07/10/2012   Medication List:  Current Outpatient Medications  Medication Sig Dispense Refill   amLODipine (NORVASC) 5 MG tablet Take 1 tablet (5 mg total) by mouth daily. 90 tablet 1   aspirin 81 MG EC tablet Take 1 tablet (81 mg total) by mouth daily. 90 tablet 1   Cholecalciferol 1.25 MG (50000 UT) TABS Take by mouth.     Continuous Blood Gluc Receiver (DEXCOM G6 RECEIVER) DEVI 1 Device by Does not apply route as directed. Change every 90 days 1 each 0   Continuous Blood Gluc Sensor (DEXCOM G6 SENSOR) MISC 1 Device by Does not apply route as directed. Change every 10 days 9 each 3   Continuous Blood Gluc Transmit (DEXCOM G6 TRANSMITTER) MISC 1 Device by Does not apply route as directed. 1 each 3   Cyanocobalamin (VITAMIN B 12 PO) Take by mouth every morning.     empagliflozin (JARDIANCE) 25 MG TABS tablet Take 1 tablet (25 mg total) by mouth daily. 90 tablet 2   glucose blood (PRECISION QID TEST) test strip Monitor BG three to four times daily, before meals and bedtime     insulin degludec (TRESIBA FLEXTOUCH) 200 UNIT/ML FlexTouch Pen Inject 90 Units into the skin daily in the afternoon. 45 mL 3   insulin lispro (HUMALOG KWIKPEN) 100 UNIT/ML KwikPen Max daily 30 units 30 mL 3   Insulin Pen Needle (B-D ULTRAFINE III SHORT PEN) 31G X  8 MM MISC Inject 1 Device into the skin in the morning, at noon, in the evening, and at bedtime. 400 each 3   losartan-hydrochlorothiazide (HYZAAR) 100-25 MG tablet Take 1 tablet by mouth daily.     meloxicam (MOBIC) 15 MG tablet Take 1 tablet (15 mg total) by mouth daily. 30 tablet 1   pantoprazole (PROTONIX) 40 MG tablet Take 1 tablet (40 mg total) by mouth daily. 30 tablet 5   pioglitazone (ACTOS) 30 MG tablet Take 1 tablet (30 mg total) by mouth daily. 90 tablet 3   Semaglutide, 2 MG/DOSE, (OZEMPIC, 2 MG/DOSE,) 8 MG/3ML SOPN Inject 2 mg into the  skin once a week. 9 mL 3   No current facility-administered medications for this visit.   Known Allergies:  Allergies  Allergen Reactions   Iodine Itching   Lisinopril Cough and Nausea And Vomiting   Saxagliptin-Metformin Er Diarrhea   Lansoprazole Nausea Only and Rash   Latex Rash   Sulfamethoxazole-Trimethoprim Rash   Past Surgical History: Past Surgical History:  Procedure Laterality Date   BACK SURGERY     CESAREAN SECTION     CHOLECYSTECTOMY     TUBAL LIGATION     Family History: Family History  Problem Relation Age of Onset   Diabetes Mother    Hypertension Father    Heart disease Father    Diabetes Father    Asthma Sister    Asthma Sister    Social History: Michelle Mercer lives in an apartment built 80 years ago.  Tile and hardwood floors.  Electric heating and central AC.  No pets.  Occasional visible cockroaches.  Using dust mite free covers for bed and pillows.  No smoke exposure.  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  Does not live near an interstate or industrial area.  Denies tobacco/vaping use..   ROS:  All other systems negative except as noted per HPI.  Objective:  Blood pressure (!) 142/68, pulse 89, temperature 98 F (36.7 C), temperature source Temporal, resp. rate 17, height _0  (1.549 m), weight 205 lb 12.8 oz (93.4 kg), SpO2 98 %. Body mass index is 38.89 kg/m. Physical Exam:  General Appearance:  Alert, cooperative, no distress, appears stated age  Head:  Normocephalic, without obvious abnormality, atraumatic  Eyes:  Conjunctiva clear, EOM's intact  Nose: Nares normal, hypertrophic boggy turbinates, no drainage,  Throat: Lips, tongue normal; teeth and gums normal, normal posterior oropharynx  Neck: Supple, symmetrical  Lungs:   Clear to auscultation bilaterally, respirations unlabored, no coughing  Heart:  Regular rate and rhythm, no murmur, appears well perfused  Extremities: No edema  Skin: Skin color, texture, turgor normal, no rashes  or lesions on visualized portions of skin  Neurologic: No gross deficits   Diagnostics:  Skin Testing: Environmental allergy panel and select foods. Positive test to: Meadow fescue, perennial rye, Timothy grass, hickory tree, maple tree, [place] oak, pecan tree, pine mix, dust mite, cat, dog. Results discussed with patient/family.  Airborne Adult Perc - 10/26/20 1513     Time Antigen Placed 1513    Allergen Manufacturer Lavella Hammock    Location Back    Number of Test 59    Panel 1 Select    1. Control-Buffer 50% Glycerol Negative    2. Control-Histamine 1 mg/ml 3+    3. Albumin saline Negative    4. [place] Negative    5. [place] Negative    6. [place] Negative    7. [place] Blue Negative  8. Meadow Fescue 3+    9. Perennial Rye 3+    10. Sweet Vernal Negative    11. Timothy 3+    12. Cocklebur Negative    13. Burweed Marshelder Negative    14. Ragweed, short Negative    15. Ragweed, Giant Negative    16. Plantain,  English Negative    17. Lamb's Quarters Negative    18. Sheep Sorrell Negative    19. Rough Pigweed Negative    20. Marsh Elder, Rough Negative    21. Mugwort, Common Negative    22. Ash mix Negative    23. Birch mix Negative    24. Beech American Negative    25. Box, Elder Negative    26. Cedar, red Negative    27. Cottonwood, [place] Negative    28. Elm mix Negative    29. Hickory 3+    30. Maple mix 3+    31. Oak, [place] mix 3+    32. Pecan Pollen 3+    33. Pine mix 3+    34. Sycamore Eastern Negative    35. [place], Black Pollen Negative    36. Alternaria alternata Negative    37. Cladosporium Herbarum Negative    38. Aspergillus mix Negative    39. Penicillium mix Negative    40. Bipolaris sorokiniana (Helminthosporium) Negative    41. Drechslera spicifera (Curvularia) Negative    42. Mucor plumbeus Negative    43. Fusarium moniliforme Negative    44. Aureobasidium pullulans (pullulara) Negative    45. Rhizopus oryzae Negative    46. Botrytis  cinera Negative    47. Epicoccum nigrum Negative    48. Phoma betae Negative    49. Candida Albicans Negative    50. Trichophyton mentagrophytes Negative    51. Mite, D Farinae  5,000 AU/ml 3+    52. Mite, D Pteronyssinus  5,000 AU/ml 3+    53. Cat Hair 10,000 BAU/ml 3+    54.  Dog Epithelia 3+    55. Mixed Feathers Negative    56. Horse Epithelia Negative    57. Cockroach, German Negative    58. Mouse Negative    59. Tobacco Leaf Negative    Comments n             Food Adult Perc - 10/26/20 1500     Time Antigen Placed 1514    Allergen Manufacturer Lavella Hammock    Location Back    5. Milk, cow Negative    6. Egg White, Chicken Negative    7. Casein Negative    43. White Potato Negative    44. Sweet Potato Negative    59. Peach Negative             Allergy testing results were read and interpreted by myself, documented by clinical staff.  Assessment:  Vomiting with specific foods:   Chronic rhinitis:   Based on her history, suspect either uncontrolled reflux versus other underlying gastrointestinal disorder including possibly eosinophilic esophagitis.  She has specific vomiting with certain food textures.  Able to tolerate dairy as milk, but thickened textures like cheese or baked dairy causes her to regurgitate.  This also happens with eggs and certain textures of potato.  This would be inconsistent with an IgE mediated food allergy, and this was ruled out today with negative IgE food allergy testing.  We will start PPI, and will place referral for GI for further evaluation.  Her mouth itching only with peaches, suspect oral allergy  syndrome.  She would likely tolerate canned or baked peaches.  Offered for her to brings to clinic if she is interested in trying these.  Her chronic rhinitis was determined to be allergic in nature based on environmental allergy testing today.  She denies overly bothersome symptoms, so we will focus on as needed medications at this time.      Plan/Recommendations:   Patient Instructions  Vomiting with specific foods  - your allergy food testing today was negative - could possibly be reflux, EOE----I think you should be evaluated by GI specialist - referral placed to GI - in meantime, start pantoprazole 40 mg nightly -- this has less cross-reactivity with lansoprazole and should be tolerated okay  Chronic rhinitis:  Okay to use an over-the-counter antihistamine for these symptoms.  Oral Allergy Syndrome (peaches): - Your symptoms are not consistent with true food allergies, and are more likely to be due to oral allergy syndrome. - These symptoms are not life-threatening and are because of a cross reaction between a pollen you are allergic to, and to a protein in specific foods (such as fresh fruits, vegetables, and nuts). - If you can eat these things it is fine to continue to do so.  If not, you may avoid these fresh fruits.  Heating these foods should allow them to be consumed without symptoms.  This note in its entirety was forwarded to the Provider who requested this consultation.  Thank you for your kind referral. I appreciate the opportunity to take part in Vasiliki's care. Please do not hesitate to contact me with questions.  Sincerely,  Sigurd Sos, MD [facility]

## 2020-10-26 NOTE — Patient Instructions (Addendum)
Vomiting with specific foods  - your allergy food testing today was negative - could possibly be reflux, EOE----I think you should be evaluated by GI specialist - referral placed to GI - in meantime, start pantoprazole 40 mg nightly -- this has less cross-reactivity with lantoprazole and should be tolerated okay  Your allergy testing to environmental were positive to grasses, trees, dust mites and cat and dog. Okay to use an over-the-counter antihistamine for these symptoms.  Suspect your mouth itching with peaches is related to your timothy grass allergy (oral food allergy syndrome). You would likely tolerate canned or baked peaches.  Peaches-suspect Oral Allergy Syndrome: - Your symptoms are not consistent with true food allergies, and are more likely to be due to oral allergy syndrome. - These symptoms are not life-threatening and are because of a cross reaction between a pollen you are allergic to, and to a protein in specific foods (such as fresh fruits, vegetables, and nuts). - If you can eat these things it is fine to continue to do so.  If not, you may avoid these fresh fruits.  Heating these foods should allow them to be consumed without symptoms.

## 2020-11-17 ENCOUNTER — Encounter: Payer: Self-pay | Admitting: Internal Medicine

## 2020-11-17 ENCOUNTER — Ambulatory Visit: Payer: 59 | Admitting: Internal Medicine

## 2020-11-17 ENCOUNTER — Other Ambulatory Visit: Payer: Self-pay

## 2020-11-17 VITALS — BP 124/72 | HR 90 | Ht 61.0 in | Wt 206.0 lb

## 2020-11-17 DIAGNOSIS — E1165 Type 2 diabetes mellitus with hyperglycemia: Secondary | ICD-10-CM

## 2020-11-17 DIAGNOSIS — Z794 Long term (current) use of insulin: Secondary | ICD-10-CM | POA: Diagnosis not present

## 2020-11-17 LAB — POCT GLYCOSYLATED HEMOGLOBIN (HGB A1C): Hemoglobin A1C: 7.1 % — AB (ref 4.0–5.6)

## 2020-11-17 LAB — GLUCOSE, POCT (MANUAL RESULT ENTRY): POC Glucose: 68 mg/dl — AB (ref 70–99)

## 2020-11-17 MED ORDER — INSULIN LISPRO (1 UNIT DIAL) 100 UNIT/ML (KWIKPEN): PEN_INJECTOR | SUBCUTANEOUS | 3 refills | Status: DC

## 2020-11-17 NOTE — Progress Notes (Signed)
Name: Michelle Mercer  Age/ Sex: 55 y.o., female   MRN/ DOB: [id number], January 08, 1966     PCP: Olive Bass, FNP   Reason for Endocrinology Evaluation: Type 2 Diabetes Mellitus  Initial Endocrine Consultative Visit: 08/11/2020    PATIENT IDENTIFIER: Michelle Mercer is a 55 y.o. female with a past medical history of T2DM, HTN and dyslipidemia . The patient has followed with Endocrinology clinic since 08/11/2020 for consultative assistance with management of her diabetes.  DIABETIC HISTORY:  Michelle Mercer was diagnosed with DM in her 39's, Metformin - diarrhea . Her hemoglobin A1c has ranged from 7.1% in 2021, peaking at 15.0% in 2020.  On her initial visit to our clinic she had an A1c of 7.7%  , she was on Jardiance, Ozempic, Pioglitazone and MDI regimen   SUBJECTIVE:   During the last visit (08/11/2020): A1c 7.7 % We increased Ozempic, continued Jardiance, pioglitazone and Tresiba   Today (11/17/2020): Michelle Mercer is here for a follow up on diabetes management.  She checks her blood sugars 3 a day . The patient has  had hypoglycemic episodes since the last clinic visit, which typically occur 1 x /week - most often occuring in the morning . The patient is  symptomatic with these episodes  She had a BG reading of 68 mg/dL in the office  She is not sure if she is on Dexcom   DIABETES REGIMEN:  Jardiance 25 mg, 1 tablet every morning  Ozempic 2 mg weekly  Tresiba 90 units once daily  Pioglitazone 30 mg daily  CF: Humalog (BG -130/20)   Statin: no ACE-I/ARB: yes    METER DOWNLOAD SUMMARY: Did not bring     DIABETIC COMPLICATIONS: Microvascular complications:   Denies: CKD, retinopathy neuropathy Last Eye Exam: Completed > 1 yr ago   Macrovascular complications:   Denies: CAD, CVA, PVD   HISTORY:  Past Medical History:  Past Medical History:  Diagnosis Date   Diabetes mellitus without complication ([facility])    Hypertension    Iron deficiency anemia, unspecified  07/10/2012   Type II or unspecified type diabetes mellitus without mention of complication, not stated as uncontrolled 07/10/2012   Past Surgical History:  Past Surgical History:  Procedure Laterality Date   BACK SURGERY     CESAREAN SECTION     CHOLECYSTECTOMY     TUBAL LIGATION     Social History:  reports that she has never smoked. She has never used smokeless tobacco. She reports current alcohol use. She reports that she does not use drugs. Family History:  Family History  Problem Relation Age of Onset   Diabetes Mother    Hypertension Father    Heart disease Father    Diabetes Father    Asthma Sister    Asthma Sister      HOME MEDICATIONS: Allergies as of 11/17/2020       Reactions   Iodine Itching   Lisinopril Cough, Nausea And Vomiting   Saxagliptin-metformin Er Diarrhea   Lansoprazole Nausea Only, Rash   Latex Rash   Sulfamethoxazole-trimethoprim Rash        Medication List        Accurate as of November 17, 2020  9:14 AM. If you have any questions, ask your nurse or doctor.          amLODipine 5 MG tablet Commonly known as: NORVASC Take 1 tablet (5 mg total) by mouth daily.   aspirin 81 MG EC tablet Take 1 tablet (81 mg total)  by mouth daily.   B-D ULTRAFINE III SHORT PEN 31G X 8 MM Misc Generic drug: Insulin Pen Needle Inject 1 Device into the skin in the morning, at noon, in the evening, and at bedtime.   Cholecalciferol 1.25 MG (50000 UT) Tabs Take by mouth.   Dexcom G6 Receiver Devi 1 Device by Does not apply route as directed. Change every 90 days   Dexcom G6 Sensor Misc 1 Device by Does not apply route as directed. Change every 10 days   Dexcom G6 Transmitter Misc 1 Device by Does not apply route as directed.   empagliflozin 25 MG Tabs tablet Commonly known as: JARDIANCE Take 1 tablet (25 mg total) by mouth daily.   insulin lispro 100 UNIT/ML KwikPen Commonly known as: HumaLOG KwikPen Max daily 30 units    losartan-hydrochlorothiazide 100-25 MG tablet Commonly known as: HYZAAR Take 1 tablet by mouth daily.   meloxicam 15 MG tablet Commonly known as: MOBIC Take 1 tablet (15 mg total) by mouth daily.   Ozempic (2 MG/DOSE) 8 MG/3ML Sopn Generic drug: Semaglutide (2 MG/DOSE) Inject 2 mg into the skin once a week.   pantoprazole 40 MG tablet Commonly known as: Protonix Take 1 tablet (40 mg total) by mouth daily.   pioglitazone 30 MG tablet Commonly known as: ACTOS Take 1 tablet (30 mg total) by mouth daily.   Precision QID Test test strip Generic drug: glucose blood Monitor BG three to four times daily, before meals and bedtime   Tresiba FlexTouch 200 UNIT/ML FlexTouch Pen Generic drug: insulin degludec Inject 90 Units into the skin daily in the afternoon.   VITAMIN B 12 PO Take by mouth every morning.         OBJECTIVE:   Vital Signs: BP 124/72 (BP Location: Left Arm, Patient Position: Sitting, Cuff Size: Small)   Pulse 90   Ht 5\' 1"  (1.549 m)   Wt 206 lb (93.4 kg)   SpO2 99%   BMI 38.92 kg/m   Wt Readings from Last 3 Encounters:  11/17/20 206 lb (93.4 kg)  10/26/20 205 lb 12.8 oz (93.4 kg)  09/29/20 202 lb 9.6 oz (91.9 kg)     Exam: General: Pt appears well and is in NAD  Lungs: Clear with good BS bilat with no rales, rhonchi, or wheezes  Heart: RRR with normal S1 and S2 and no gallops; no murmurs; no rub  Abdomen: Normoactive bowel sounds, soft, nontender, without masses or organomegaly palpable  Extremities: No pretibial edema.   Neuro: MS is good with appropriate affect, pt is alert and Ox3    DM foot exam: 11/17/2020  The skin of the feet is intact without sores or ulcerations. The pedal pulses are 2+ on right and 2+ on left. The sensation is intact to a screening 5.07, 10 gram monofilament bilaterally        DATA REVIEWED:  Lab Results  Component Value Date   HGBA1C 7.1 (A) 11/17/2020   HGBA1C 7.7 (H) 06/19/2020   Lab Results  Component  Value Date   MICROALBUR 29.8 (H) 08/11/2020   LDLCALC 62 08/11/2020   CREATININE 0.90 08/11/2020   Lab Results  Component Value Date   MICRALBCREAT 30.8 (H) 08/11/2020     Lab Results  Component Value Date   CHOL 104 08/11/2020   HDL 31.80 (L) 08/11/2020   LDLCALC 62 08/11/2020   TRIG 51.0 08/11/2020   CHOLHDL 3 08/11/2020         ASSESSMENT / PLAN / RECOMMENDATIONS:  1) Type 2 Diabetes Mellitus, Sub-Optimally controlled, With microalbuminuria  - Most recent A1c of 7.1 %. Goal A1c < 7.0 %.    - A1c trending down - She has received the dexcom but has not been able to download the app, will refer to our CDE for proper training  - She has a BG of 68 mg/dL in the office, juice was provided, will reduce Michelle Mercer  as  below  - She is not sure she is on Actos, pt was advised to check on this    MEDICATIONS: - Continue Jardiance 25 mg, 1 tablet every morning  - Continue Ozempic  2 mg weekly  - Decrease Tresiba 74 units once daily  - Take Actos (Pioglitazone ) 30 mg daily  Correction Scale (BG -130/20)   EDUCATION / INSTRUCTIONS: BG monitoring instructions: Patient is instructed to check her blood sugars 3 times a day, before each meal . Call [facility] if: BG persistently < 70  I reviewed the Rule of 15 for the treatment of hypoglycemia in detail with the patient. Literature supplied.    2) Diabetic complications:  Eye: Does not have known diabetic retinopathy.  Neuro/ Feet: Does not have known diabetic peripheral neuropathy .  Renal: Patient does not have known baseline CKD. She   is  on an ACEI/ARB at present.    3) Lipids: Patient is not on a statin. LDL at goal and there's no indication for statin therapy at this time     F/U in 4 months     Signed electronically by: Lyndle Herrlich, MD  [facility]  [facility] [address] [place] [place], [place] [address] Phone: [phone] FAX:  [phone]   CC: Olive Bass, FNP [address] [place] [address] Phone: [phone]  Fax: [phone]  Return to Endocrinology clinic as below: No future appointments.

## 2020-11-17 NOTE — Patient Instructions (Addendum)
-   Continue Jardiance 25 mg, 1 tablet every morning  - Continue Ozempic  2 mg weekly  - Decrease Tresiba 74 units once daily  - Take Actos (Pioglitazone ) 30 mg daily  - Humalog correctional insulin: ADD extra units on insulin to your meal-time Humalog dose if your blood sugars are higher than 150. Use the scale below to help guide you:   Blood sugar before meal Number of units to inject  Less than 150 0 unit  151 -  170 1 units  171 -  190 2 units  191 -  210 3 units  211 -  230 4 units  231 -  250 5 units  251 -  270 6 units  271 -  290 7 units  291 -  310 8 units  311 - 330 9 units    HOW TO TREAT LOW BLOOD SUGARS (Blood sugar LESS THAN 70 MG/DL) Please follow the RULE OF 15 for the treatment of hypoglycemia treatment (when your (blood sugars are less than 70 mg/dL)   STEP 1: Take 15 grams of carbohydrates when your blood sugar is low, which includes:  3-4 GLUCOSE TABS  OR 3-4 OZ OF JUICE OR REGULAR SODA OR ONE TUBE OF GLUCOSE GEL    STEP 2: RECHECK blood sugar in 15 MINUTES STEP 3: If your blood sugar is still low at the 15 minute recheck --> then, go back to STEP 1 and treat AGAIN with another 15 grams of carbohydrates.

## 2020-11-24 ENCOUNTER — Telehealth: Payer: Self-pay | Admitting: Pharmacy Technician

## 2020-11-24 ENCOUNTER — Telehealth: Payer: Self-pay

## 2020-11-24 ENCOUNTER — Other Ambulatory Visit (HOSPITAL_COMMUNITY): Payer: Self-pay

## 2020-11-24 NOTE — Telephone Encounter (Signed)
PA needed on Insulin Lispro.

## 2020-11-24 NOTE — Telephone Encounter (Signed)
Patient Advocate Encounter   Received notification from [employer] that prior authorization for HUMALOG is required.   PT COVERAGE EXPIRED ON 9.30.22  Montez Morita, CPhT Patient Sports administrator End[facility]in[facility] Phone: [phone] Fax:  [phone]

## 2020-11-24 NOTE — Telephone Encounter (Signed)
Filled after coverage. Coverage termed 9.30.22

## 2020-12-23 ENCOUNTER — Telehealth: Payer: Self-pay | Admitting: *Deleted

## 2020-12-23 ENCOUNTER — Other Ambulatory Visit (HOSPITAL_BASED_OUTPATIENT_CLINIC_OR_DEPARTMENT_OTHER): Payer: Self-pay

## 2020-12-23 MED ORDER — OZEMPIC (2 MG/DOSE) 8 MG/3ML ~~LOC~~ SOPN
2.0000 mg | PEN_INJECTOR | SUBCUTANEOUS | 3 refills | Status: DC
  Filled 2020-12-23: qty 3, 28d supply, fill #0

## 2020-12-23 NOTE — Telephone Encounter (Signed)
Script sent to [facility] per patient request.

## 2020-12-23 NOTE — Telephone Encounter (Signed)
Ozempic is on back order at pharmacy with no release date at [employer] [facility].  Patient would like it sent over [facility].

## 2021-01-01 ENCOUNTER — Other Ambulatory Visit (HOSPITAL_BASED_OUTPATIENT_CLINIC_OR_DEPARTMENT_OTHER): Payer: Self-pay

## 2021-01-14 ENCOUNTER — Ambulatory Visit (INDEPENDENT_AMBULATORY_CARE_PROVIDER_SITE_OTHER): Payer: 59 | Admitting: Medical

## 2021-01-14 ENCOUNTER — Other Ambulatory Visit: Payer: Self-pay

## 2021-01-14 ENCOUNTER — Ambulatory Visit (HOSPITAL_BASED_OUTPATIENT_CLINIC_OR_DEPARTMENT_OTHER)
Admission: RE | Admit: 2021-01-14 | Discharge: 2021-01-14 | Disposition: A | Discharge status: Home / Self Care | Payer: 59 | Source: Ambulatory Visit | Attending: Medical | Admitting: Medical

## 2021-01-14 VITALS — BP 150/70 | HR 82 | Temp 98.8°F | Ht 61.0 in | Wt 201.5 lb

## 2021-01-14 DIAGNOSIS — R059 Cough, unspecified: Secondary | ICD-10-CM | POA: Insufficient documentation

## 2021-01-14 DIAGNOSIS — U071 COVID-19: Secondary | ICD-10-CM

## 2021-01-14 DIAGNOSIS — J40 Bronchitis, not specified as acute or chronic: Secondary | ICD-10-CM

## 2021-01-14 DIAGNOSIS — R0982 Postnasal drip: Secondary | ICD-10-CM

## 2021-01-14 LAB — POC COVID19 BINAXNOW: SARS Coronavirus 2 Ag: POSITIVE — AB

## 2021-01-14 MED ORDER — BENZONATATE 100 MG PO CAPS: 100.0000 mg | ORAL_CAPSULE | Freq: Three times a day (TID) | ORAL | 0 refills | Status: DC | PRN

## 2021-01-14 MED ORDER — AZITHROMYCIN 250 MG PO TABS: ORAL_TABLET | ORAL | 0 refills | Status: AC

## 2021-01-14 MED ORDER — FLUTICASONE PROPIONATE 50 MCG/ACT NA SUSP: 2.0000 | Freq: Every day | NASAL | 1 refills | Status: DC

## 2021-01-14 MED ORDER — MOLNUPIRAVIR EUA 200MG CAPSULE: 4.0000 | ORAL_CAPSULE | Freq: Two times a day (BID) | ORAL | 0 refills | Status: AC

## 2021-01-14 MED ORDER — ALBUTEROL SULFATE HFA 108 (90 BASE) MCG/ACT IN AERS: 2.0000 | INHALATION_SPRAY | Freq: Four times a day (QID) | RESPIRATORY_TRACT | 0 refills | Status: DC | PRN

## 2021-01-14 NOTE — Progress Notes (Signed)
Subjective:    Patient ID: Michelle Mercer, female    DOB: 13-Apr-1965, 55 y.o.   MRN: [id number]  HPI  Pt has recent cough before thanksgiving. More than a week. Moderate-severe cough all day and night. No wheezing. No history of asthma or inhaler use.   Non smoker.  Most of time dry cough. Occasional will bring up small amount of mucus.  Pt feels some pnd. At beginning felt stuffy nose.  No reflux reported.   No fever, no sweats, no bodyaches. Pt has not tested for covid since symptoms started 10 days ago.   Pt had covid vaccine x 2 in the past.   Review of Systems  Constitutional:  Negative for chills, fatigue and fever.  HENT:  Positive for congestion. Negative for drooling, sinus pressure and sinus pain.   Respiratory:  Positive for cough. Negative for chest tightness, shortness of breath and wheezing.   Cardiovascular:  Negative for chest pain and palpitations.  Gastrointestinal:  Negative for abdominal distention and anal bleeding.  Musculoskeletal:  Negative for back pain.  Neurological:  Negative for dizziness, tremors, speech difficulty, weakness, numbness and headaches.  Hematological:  Negative for adenopathy. Does not bruise/bleed easily.    Past Medical History:  Diagnosis Date   Diabetes mellitus without complication (HCC)    Hypertension    Iron deficiency anemia, unspecified 07/10/2012   Type II or unspecified type diabetes mellitus without mention of complication, not stated as uncontrolled 07/10/2012     Social History   Socioeconomic History   Marital status: Single    Spouse name: Not on file   Number of children: Not on file   Years of education: Not on file   Highest education level: Not on file  Occupational History   Not on file  Tobacco Use   Smoking status: Never   Smokeless tobacco: Never  Substance and Sexual Activity   Alcohol use: Yes    Comment: occasional   Drug use: No   Sexual activity: Not on file  Other Topics Concern    Not on file  Social History Narrative   Not on file   Social Determinants of Health   Financial Resource Strain: Not on file  Food Insecurity: Not on file  Transportation Needs: Not on file  Physical Activity: Not on file  Stress: Not on file  Social Connections: Not on file  Intimate Partner Violence: Not on file    Past Surgical History:  Procedure Laterality Date   BACK SURGERY     CESAREAN SECTION     CHOLECYSTECTOMY     TUBAL LIGATION      Family History  Problem Relation Age of Onset   Diabetes Mother    Hypertension Father    Heart disease Father    Diabetes Father    Asthma Sister    Asthma Sister     Allergies  Allergen Reactions   Iodine Itching   Lisinopril Cough and Nausea And Vomiting   Saxagliptin-Metformin Er Diarrhea   Lansoprazole Nausea Only and Rash   Latex Rash   Sulfamethoxazole-Trimethoprim Rash    Current Outpatient Medications on File Prior to Visit  Medication Sig Dispense Refill   amLODipine (NORVASC) 5 MG tablet Take 1 tablet (5 mg total) by mouth daily. 90 tablet 1   aspirin 81 MG EC tablet Take 1 tablet (81 mg total) by mouth daily. 90 tablet 1   Cholecalciferol 1.25 MG (50000 UT) TABS Take by mouth.     Continuous  Blood Gluc Receiver (DEXCOM G6 RECEIVER) DEVI 1 Device by Does not apply route as directed. Change every 90 days 1 each 0   Continuous Blood Gluc Sensor (DEXCOM G6 SENSOR) MISC 1 Device by Does not apply route as directed. Change every 10 days 9 each 3   Continuous Blood Gluc Transmit (DEXCOM G6 TRANSMITTER) MISC 1 Device by Does not apply route as directed. 1 each 3   Cyanocobalamin (VITAMIN B 12 PO) Take by mouth every morning.     empagliflozin (JARDIANCE) 25 MG TABS tablet Take 1 tablet (25 mg total) by mouth daily. 90 tablet 2   glucose blood (PRECISION QID TEST) test strip Monitor BG three to four times daily, before meals and bedtime     insulin degludec (TRESIBA FLEXTOUCH) 200 UNIT/ML FlexTouch Pen Inject 90 Units  into the skin daily in the afternoon. 45 mL 3   insulin lispro (HUMALOG KWIKPEN) 100 UNIT/ML KwikPen Max daily 30 units per scale 30 mL 3   Insulin Pen Needle (B-D ULTRAFINE III SHORT PEN) 31G X 8 MM MISC Inject 1 Device into the skin in the morning, at noon, in the evening, and at bedtime. 400 each 3   losartan-hydrochlorothiazide (HYZAAR) 100-25 MG tablet Take 1 tablet by mouth daily.     meloxicam (MOBIC) 15 MG tablet Take 1 tablet (15 mg total) by mouth daily. 30 tablet 1   pantoprazole (PROTONIX) 40 MG tablet Take 1 tablet (40 mg total) by mouth daily. 30 tablet 5   pioglitazone (ACTOS) 30 MG tablet Take 1 tablet (30 mg total) by mouth daily. 90 tablet 3   Semaglutide, 2 MG/DOSE, (OZEMPIC, 2 MG/DOSE,) 8 MG/3ML SOPN Inject 2 mg into the skin once a week. 9 mL 3   No current facility-administered medications on file prior to visit.    BP (!) 150/70   Pulse 82   Temp 98.8 F (37.1 C)   Ht 5\' 1"  (1.549 m)   Wt 201 lb 8 oz (91.4 kg)   SpO2 98%   BMI 38.07 kg/m       Objective:   Physical Exam  General- No acute distress. Pleasant patient. Neck- Full range of motion, no jvd Lungs- Clear, even and unlabored. But on deep breaths cause cough. Heart- regular rate and rhythm. Neurologic- CNII- XII grossly intact.  Heent- no sinus pressure.      Assessment & Plan:   Patient Instructions  You appear to have bronchitis past 10 days. Rest hydrate and tylenol for fever. I am prescribing cough medicine benzonatate, and aziythromic antibiotic. For your nasal congestion flonase.   Chest xray today.  If symptoms change or worsen let know.  Follow up in 7-10 days or as needed       [employer], [employer]

## 2021-01-14 NOTE — Addendum Note (Signed)
Addended by: Maximino Sarin on: 01/14/2021 11:36 AM   Modules accepted: Orders

## 2021-01-14 NOTE — Patient Instructions (Addendum)
You appear to have bronchitis past 10 days. Rest hydrate and tylenol for fever. I am prescribing cough medicine benzonatate, and aziythromic antibiotic. For your nasal congestion flonase.   Chest xray today.  If symptoms change or worsen let us know.  Follow up in 7-10 days or as needed    On discussion on desire to return to work decided best to do covid test. Came back positive. Exact time line of when you got sick with covid difficult to say. Initially symptoms only uri, then bronchitis like. No diffuse viral syndrome symptoms yet? Rx monlupiravir. Can return to work in 6 days if better and no fever. Wear mask additional 5 days.

## 2021-01-14 NOTE — Addendum Note (Signed)
Addended by: Gwenevere Abbot on: 01/14/2021 11:43 AM   Modules accepted: Orders, Level of Service

## 2021-01-18 ENCOUNTER — Telehealth: Payer: Self-pay

## 2021-01-18 NOTE — Telephone Encounter (Signed)
(  Michelle Mercer pt)   Ravinder Hofland Ozempic will cost her $900. Patient wanted to know if there was a cheaper Rx option? Per patient has been without Rx for 3 weeks.   Patient has also been advised to call her insurance company. To check on cost and what she is willing to pay.  Call back phone [phone].

## 2021-01-18 NOTE — Telephone Encounter (Signed)
Vm left for patient and also mychart message sent  

## 2021-01-19 MED ORDER — TRULICITY 1.5 MG/0.5ML ~~LOC~~ SOAJ: 1.5000 mg | SUBCUTANEOUS | 3 refills | Status: DC

## 2021-01-19 NOTE — Telephone Encounter (Signed)
Pt called and stated she would like a call back from Dr. Harvel Ricks cma regarding her questions about ozempic. She said she did not feel like who she spoke with the other day gave her clear answers. She said she is waiting on a coupon from ozempic to see if it is cheaper and also a list from insurance to see what insulin they cover so she could get switched over. Also mentioned she is waiting on prior authorization for fast acting insulin but could not remember the name and said she takes it with meals. Please advise.

## 2021-01-19 NOTE — Telephone Encounter (Signed)
Pt stated.  She says she called yesterday but she was displeased with whomever she spoke with up front. She basically needs a message forwarded to Dr. Lonzo Cloud that her Ozempic is $800.00 for her and no longer covered by insurance. She is wanting to see if an alternate tx can be sent to [facility] for her instead.

## 2021-01-19 NOTE — Telephone Encounter (Signed)
Spoke with patient and she will contact Michelle Mercer to see if the Trulicity is affordable for her and let us know.

## 2021-01-19 NOTE — Telephone Encounter (Signed)
Patient was advised that Trulicity was sent into pharmacy and she will check coverage.

## 2021-02-16 ENCOUNTER — Telehealth: Payer: Self-pay

## 2021-02-16 NOTE — Telephone Encounter (Addendum)
Disregard this message 

## 2021-03-22 ENCOUNTER — Ambulatory Visit: Payer: 59 | Admitting: Internal Medicine

## 2021-03-22 NOTE — Progress Notes (Deleted)
Name: Michelle Mercer  Age/ Sex: 56 y.o., female   MRN/ DOB: [id number], Mar 24, 1965     PCP: Michelle Bass, FNP   Reason for Endocrinology Evaluation: Type 2 Diabetes Mellitus  Initial Endocrine Consultative Visit: 08/11/2020    PATIENT IDENTIFIER: [name] is a 56 y.o. female with a past medical history of T2DM, HTN and dyslipidemia . The patient has followed with Endocrinology clinic since 08/11/2020 for consultative assistance with management of her diabetes.  DIABETIC HISTORY:  Michelle Mercer was diagnosed with DM in her 61's, Metformin - diarrhea . Her hemoglobin A1c has ranged from 7.1% in 2021, peaking at 15.0% in 2020.  On her initial visit to our clinic she had an A1c of 7.7%  , she was on Jardiance, Ozempic, Pioglitazone and MDI regimen   SUBJECTIVE:   During the last visit (10/42022): A1c 7.1% We continued Ozempic, Jardiance, pioglitazone and Tresiba   Today (03/22/2021): Michelle Mercer is here for a follow up on diabetes management.  She checks her blood sugars 3 a day . The patient has  had hypoglycemic episodes since the last clinic visit, which typically occur 1 x /week - most often occuring in the morning . The patient is  symptomatic with these episodes  She had a BG reading of 68 mg/dL in the office  She is not sure if she is on Dexcom   DIABETES REGIMEN:  Jardiance 25 mg, 1 tablet every morning  Ozempic 2 mg weekly  Tresiba 90 units once daily  Pioglitazone 30 mg daily  CF: Humalog (BG -130/20)   Statin: no ACE-I/ARB: yes    METER DOWNLOAD SUMMARY: Did not bring     DIABETIC COMPLICATIONS: Microvascular complications:   Denies: CKD, retinopathy neuropathy Last Eye Exam: Completed > 1 yr ago   Macrovascular complications:   Denies: CAD, CVA, PVD   HISTORY:  Past Medical History:  Past Medical History:  Diagnosis Date   Diabetes mellitus without complication (HCC)    Hypertension    Iron deficiency anemia, unspecified 07/10/2012    Type II or unspecified type diabetes mellitus without mention of complication, not stated as uncontrolled 07/10/2012   Past Surgical History:  Past Surgical History:  Procedure Laterality Date   BACK SURGERY     CESAREAN SECTION     CHOLECYSTECTOMY     TUBAL LIGATION     Social History:  reports that she has never smoked. She has never used smokeless tobacco. She reports current alcohol use. She reports that she does not use drugs. Family History:  Family History  Problem Relation Age of Onset   Diabetes Mother    Hypertension Father    Heart disease Father    Diabetes Father    Asthma Sister    Asthma Sister      HOME MEDICATIONS: Allergies as of 03/22/2021       Reactions   Iodine Itching   Lisinopril Cough, Nausea And Vomiting   Saxagliptin-metformin Er Diarrhea   Lansoprazole Nausea Only, Rash   Latex Rash   Sulfamethoxazole-trimethoprim Rash        Medication List        Accurate as of March 22, 2021  7:12 AM. If you have any questions, ask your nurse or doctor.          albuterol 108 (90 Base) MCG/ACT inhaler Commonly known as: VENTOLIN HFA Inhale 2 puffs into the lungs every 6 (six) hours as needed for wheezing.   amLODipine 5 MG tablet Commonly  known as: NORVASC Take 1 tablet (5 mg total) by mouth daily.   aspirin 81 MG EC tablet Take 1 tablet (81 mg total) by mouth daily.   B-D ULTRAFINE III SHORT PEN 31G X 8 MM Misc Generic drug: Insulin Pen Needle Inject 1 Device into the skin in the morning, at noon, in the evening, and at bedtime.   benzonatate 100 MG capsule Commonly known as: TESSALON Take 1 capsule (100 mg total) by mouth 3 (three) times daily as needed for cough.   Cholecalciferol 1.25 MG (50000 UT) Tabs Take by mouth.   Dexcom G6 Receiver Devi 1 Device by Does not apply route as directed. Change every 90 days   Dexcom G6 Sensor Misc 1 Device by Does not apply route as directed. Change every 10 days   Dexcom G6 Transmitter  Misc 1 Device by Does not apply route as directed.   empagliflozin 25 MG Tabs tablet Commonly known as: JARDIANCE Take 1 tablet (25 mg total) by mouth daily.   fluticasone 50 MCG/ACT nasal spray Commonly known as: FLONASE Place 2 sprays into both nostrils daily.   insulin lispro 100 UNIT/ML KwikPen Commonly known as: HumaLOG KwikPen Max daily 30 units per scale   losartan-hydrochlorothiazide 100-25 MG tablet Commonly known as: HYZAAR Take 1 tablet by mouth daily.   meloxicam 15 MG tablet Commonly known as: MOBIC Take 1 tablet (15 mg total) by mouth daily.   pantoprazole 40 MG tablet Commonly known as: Protonix Take 1 tablet (40 mg total) by mouth daily.   pioglitazone 30 MG tablet Commonly known as: ACTOS Take 1 tablet (30 mg total) by mouth daily.   Precision QID Test test strip Generic drug: glucose blood Monitor BG three to four times daily, before meals and bedtime   Tresiba FlexTouch 200 UNIT/ML FlexTouch Pen Generic drug: insulin degludec Inject 90 Units into the skin daily in the afternoon.   Trulicity 1.5 MG/0.5ML Sopn Generic drug: Dulaglutide Inject 1.5 mg into the skin once a week.   VITAMIN B 12 PO Take by mouth every morning.         OBJECTIVE:   Vital Signs: There were no vitals taken for this visit.  Wt Readings from Last 3 Encounters:  01/14/21 201 lb 8 oz (91.4 kg)  11/17/20 206 lb (93.4 kg)  10/26/20 205 lb 12.8 oz (93.4 kg)     Exam: General: Pt appears well and is in NAD  Lungs: Clear with good BS bilat with no rales, rhonchi, or wheezes  Heart: RRR with normal S1 and S2 and no gallops; no murmurs; no rub  Abdomen: Normoactive bowel sounds, soft, nontender, without masses or organomegaly palpable  Extremities: No pretibial edema.   Neuro: MS is good with appropriate affect, pt is alert and Ox3    DM foot exam: 11/17/2020  The skin of the feet is intact without sores or ulcerations. The pedal pulses are 2+ on right and 2+ on  left. The sensation is intact to a screening 5.07, 10 gram monofilament bilaterally        DATA REVIEWED:  Lab Results  Component Value Date   HGBA1C 7.1 (A) 11/17/2020   HGBA1C 7.7 (H) 06/19/2020   Lab Results  Component Value Date   MICROALBUR 29.8 (H) 08/11/2020   LDLCALC 62 08/11/2020   CREATININE 0.90 08/11/2020   Lab Results  Component Value Date   MICRALBCREAT 30.8 (H) 08/11/2020     Lab Results  Component Value Date   CHOL 104  08/11/2020   HDL 31.80 (L) 08/11/2020   LDLCALC 62 08/11/2020   TRIG 51.0 08/11/2020   CHOLHDL 3 08/11/2020         ASSESSMENT / PLAN / RECOMMENDATIONS:   1) Type 2 Diabetes Mellitus, Sub-Optimally controlled, With microalbuminuria  - Most recent A1c of 7.1 %. Goal A1c < 7.0 %.    - A1c trending down - She has received the dexcom but has not been able to download the app, will refer to our CDE for proper training  - She has a BG of 68 mg/dL in the office, juice was provided, will reduce Evaristo Bury  as  below  - She is not sure she is on Actos, pt was advised to check on this    MEDICATIONS: - Continue Jardiance 25 mg, 1 tablet every morning  - Continue Ozempic  2 mg weekly  - Decrease Tresiba 74 units once daily  - Take Actos (Pioglitazone ) 30 mg daily  Correction Scale (BG -130/20)   EDUCATION / INSTRUCTIONS: BG monitoring instructions: Patient is instructed to check her blood sugars 3 times a day, before each meal . Call [facility] if: BG persistently < 70  I reviewed the Rule of 15 for the treatment of hypoglycemia in detail with the patient. Literature supplied.    2) Diabetic complications:  Eye: Does not have known diabetic retinopathy.  Neuro/ Feet: Does not have known diabetic peripheral neuropathy .  Renal: Patient does not have known baseline CKD. She   is  on an ACEI/ARB at present.    3) Lipids: Patient is not on a statin. LDL at goal and there's no indication for statin therapy at this time      F/U in 4 months     Signed electronically by: Lyndle Herrlich, MD  [facility] End[facility]  [facility] [address] [place] [place], [place] [address] Phone: [phone] FAX: [phone]   CC: Michelle Bass, FNP [address] [place] [address] Phone: [phone]  Fax: [phone]  Return to Endocrinology clinic as below: Future Appointments  Date Time Provider Department Center  03/22/2021  8:30 AM Timm Bonenberger, Konrad Dolores, MD [facility] None

## 2021-03-26 ENCOUNTER — Encounter (HOSPITAL_BASED_OUTPATIENT_CLINIC_OR_DEPARTMENT_OTHER): Payer: Self-pay

## 2021-03-26 ENCOUNTER — Emergency Department (HOSPITAL_BASED_OUTPATIENT_CLINIC_OR_DEPARTMENT_OTHER): Payer: BLUE CROSS/BLUE SHIELD

## 2021-03-26 ENCOUNTER — Other Ambulatory Visit: Payer: Self-pay

## 2021-03-26 ENCOUNTER — Emergency Department (HOSPITAL_BASED_OUTPATIENT_CLINIC_OR_DEPARTMENT_OTHER)
Admission: EM | Admit: 2021-03-26 | Discharge: 2021-03-26 | Disposition: A | Discharge status: Home / Self Care | Payer: BLUE CROSS/BLUE SHIELD | Attending: Emergency Medicine | Admitting: Emergency Medicine

## 2021-03-26 DIAGNOSIS — Z79899 Other long term (current) drug therapy: Secondary | ICD-10-CM | POA: Insufficient documentation

## 2021-03-26 DIAGNOSIS — Z7984 Long term (current) use of oral hypoglycemic drugs: Secondary | ICD-10-CM | POA: Diagnosis not present

## 2021-03-26 DIAGNOSIS — Z794 Long term (current) use of insulin: Secondary | ICD-10-CM | POA: Diagnosis not present

## 2021-03-26 DIAGNOSIS — Z91040 Latex allergy status: Secondary | ICD-10-CM | POA: Diagnosis not present

## 2021-03-26 DIAGNOSIS — M79605 Pain in left leg: Secondary | ICD-10-CM

## 2021-03-26 DIAGNOSIS — M79662 Pain in left lower leg: Secondary | ICD-10-CM | POA: Diagnosis present

## 2021-03-26 DIAGNOSIS — I10 Essential (primary) hypertension: Secondary | ICD-10-CM | POA: Insufficient documentation

## 2021-03-26 DIAGNOSIS — E119 Type 2 diabetes mellitus without complications: Secondary | ICD-10-CM | POA: Insufficient documentation

## 2021-03-26 DIAGNOSIS — Z7982 Long term (current) use of aspirin: Secondary | ICD-10-CM | POA: Diagnosis not present

## 2021-03-26 NOTE — ED Triage Notes (Signed)
Pt reports left calf pain since Sunday. No injury

## 2021-03-26 NOTE — Discharge Instructions (Signed)
You were seen in the emergency department for left calf pain. The ultrasound study was negative for DVT in your left leg.  I recommend rest, ice and heat, elevation, and compression.  I also recommend taking ibuprofen and Tylenol for inflammation and pain.  Recommend following up with your primary care provider if your symptoms do not improve.  Return to the emergency department if you develop life-threatening symptoms such as shortness of breath or chest pain

## 2021-03-26 NOTE — ED Provider Notes (Signed)
[place] EMERGENCY DEPARTMENT Provider Note   CSN: [id number] Arrival date & time: 03/26/21  1809     History  Chief Complaint  Patient presents with   Leg Pain    Michelle Mercer is a 56 y.o. female.  The patient complains of left lower leg pain that has been ongoing since Sunday.  She denies any injury.  She states that the pain is in the back of her leg in the calf region.  The patient has past medical history significant for DVT approximately 10 years ago, type 2 diabetes, hypertension, and osteoarthritis.  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  HPI     Home Medications Prior to Admission medications   Medication Sig Start Date End Date Taking? Authorizing Provider  albuterol (VENTOLIN HFA) 108 (90 Base) MCG/ACT inhaler Inhale 2 puffs into the lungs every 6 (six) hours as needed for wheezing. 01/14/21   Saguier, Percell Miller, PA-C  amLODipine (NORVASC) 5 MG tablet Take 1 tablet (5 mg total) by mouth daily. 09/29/20   Marrian Salvage, FNP  aspirin 81 MG EC tablet Take 1 tablet (81 mg total) by mouth daily. 08/19/20   Marrian Salvage, FNP  benzonatate (TESSALON) 100 MG capsule Take 1 capsule (100 mg total) by mouth 3 (three) times daily as needed for cough. 01/14/21   Saguier, Percell Miller, PA-C  Cholecalciferol 1.25 MG (50000 UT) TABS Take by mouth.    [provider]  Continuous Blood Gluc Receiver (DEXCOM G6 RECEIVER) DEVI 1 Device by Does not apply route as directed. Change every 90 days 09/04/20   Shamleffer, Melanie Crazier, MD  Continuous Blood Gluc Sensor (DEXCOM G6 SENSOR) MISC 1 Device by Does not apply route as directed. Change every 10 days 09/04/20   Shamleffer, Melanie Crazier, MD  Continuous Blood Gluc Transmit (DEXCOM G6 TRANSMITTER) MISC 1 Device by Does not apply route as directed. 09/04/20   Shamleffer, Melanie Crazier, MD  Cyanocobalamin (VITAMIN B 12 PO) Take by mouth every morning.    [provider]   Dulaglutide (TRULICITY) 1.5 0000000 SOPN Inject 1.5 mg into the skin once a week. 01/19/21   Shamleffer, Melanie Crazier, MD  empagliflozin (JARDIANCE) 25 MG TABS tablet Take 1 tablet (25 mg total) by mouth daily. 08/11/20   Shamleffer, Melanie Crazier, MD  fluticasone (FLONASE) 50 MCG/ACT nasal spray Place 2 sprays into both nostrils daily. 01/14/21   Saguier, Percell Miller, PA-C  glucose blood (PRECISION QID TEST) test strip Monitor BG three to four times daily, before meals and bedtime 06/10/15   [provider]  insulin degludec (TRESIBA FLEXTOUCH) 200 UNIT/ML FlexTouch Pen Inject 90 Units into the skin daily in the afternoon. 08/11/20   Shamleffer, Melanie Crazier, MD  insulin lispro (HUMALOG KWIKPEN) 100 UNIT/ML KwikPen Max daily 30 units per scale 11/17/20   Shamleffer, Melanie Crazier, MD  Insulin Pen Needle (B-D ULTRAFINE III SHORT PEN) 31G X 8 MM MISC Inject 1 Device into the skin in the morning, at noon, in the evening, and at bedtime. 08/11/20   Shamleffer, Melanie Crazier, MD  losartan-hydrochlorothiazide (HYZAAR) 100-25 MG tablet Take 1 tablet by mouth daily. 08/01/16   [provider]  meloxicam (MOBIC) 15 MG tablet Take 1 tablet (15 mg total) by mouth daily. 09/29/20   Marrian Salvage, FNP  pantoprazole (PROTONIX) 40 MG tablet Take 1 tablet (40 mg total) by mouth daily. 10/26/20   Sigurd Sos, MD  pioglitazone (ACTOS) 30 MG tablet Take 1 tablet (  30 mg total) by mouth daily. 08/11/20   Shamleffer, Konrad Dolores, MD      Allergies    Iodine, Lisinopril, Saxagliptin-metformin er, Lansoprazole, Latex, and Sulfamethoxazole-trimethoprim    Review of Systems   Review of Systems  Constitutional:  Negative for activity change.  Respiratory:  Negative for chest tightness and shortness of breath.   Cardiovascular:  Negative for chest pain and leg swelling.  Musculoskeletal:  Positive for myalgias (Left calf pain).  Skin:  Negative for color change.  Neurological:  Negative  for weakness.   Physical Exam Updated Vital Signs BP (!) 172/77 (BP Location: Left Arm)    Pulse 88    Temp 97.9 F (36.6 C) (Oral)    Resp 18    Wt 90.7 kg    SpO2 100%    BMI 37.79 kg/m  Physical Exam Constitutional:      General: She is not in acute distress. HENT:     Head: Normocephalic.  Eyes:     Conjunctiva/sclera: Conjunctivae normal.  Cardiovascular:     Rate and Rhythm: Normal rate and regular rhythm.     Pulses: Normal pulses.  Pulmonary:     Effort: Pulmonary effort is normal. No respiratory distress.     Breath sounds: Normal breath sounds.  Musculoskeletal:        General: Tenderness (Left calf tenderness, increased with dorsiflexion of foot) present. Normal range of motion.     Cervical back: Normal range of motion.  Skin:    General: Skin is warm and dry.     Findings: No bruising or erythema.  Neurological:     Mental Status: She is alert.    ED Results / Procedures / Treatments   Labs (all labs ordered are listed, but only abnormal results are displayed) Labs Reviewed - No data to display  EKG None  Radiology DG Tibia/Fibula Left  Result Date: 03/26/2021 CLINICAL DATA:  Pain EXAM: LEFT TIBIA AND FIBULA - 2 VIEW COMPARISON:  None. FINDINGS: No fracture or dislocation is seen. There is no significant effusion in the left knee. There is mild cortical irregularity in the proximal shaft of left fibula. Bony spurs are noted at the tip of lateral malleolus along with 7.5 mm smooth marginated calcification. IMPRESSION: No recent fracture or dislocation is seen. Smooth marginated calcification adjacent to the tip of lateral malleolus may be residual from previous injury. Mild cortical irregularity without radiolucent line seen in the proximal shaft of left fibula may be normal variation or residual change from previous injury. Electronically Signed   By: Ernie Avena M.D.   On: 03/26/2021 18:56   US Venous Img Lower Unilateral Left  Result Date:  03/26/2021 CLINICAL DATA:  Pain EXAM: Left LOWER EXTREMITY VENOUS DOPPLER ULTRASOUND TECHNIQUE: Gray-scale sonography with compression, as well as color and duplex ultrasound, were performed to evaluate the deep venous system(s) from the level of the common femoral vein through the popliteal and proximal calf veins. COMPARISON:  None. FINDINGS: VENOUS Normal compressibility of the common femoral, superficial femoral, and popliteal veins, as well as the visualized calf veins. Visualized portions of profunda femoral vein and great saphenous vein unremarkable. No filling defects to suggest DVT on grayscale or color Doppler imaging. Doppler waveforms show normal direction of venous flow, normal respiratory plasticity and response to augmentation. Limited views of the contralateral common femoral vein are unremarkable. OTHER None. Limitations: none IMPRESSION: There is no evidence of deep venous thrombosis in the left lower extremity. Electronically Signed  By: Elmer Picker M.D.   On: 03/26/2021 20:38    Procedures Procedures    Medications Ordered in ED Medications - No data to display  ED Course/ Medical Decision Making/ A&P                           Medical Decision Making Amount and/or Complexity of Data Reviewed Radiology: ordered. ECG/medicine tests: ordered.   This patient presents to the ED for concern of left calf pain, this involves an extensive number of treatment options, and is a complaint that carries with it a high risk of complications and morbidity.  The differential diagnosis includes but is not limited to DVT, muscular strain, ischemia   Co morbidities that complicate the patient evaluation  None   Additional history obtained:   External records from outside source obtained and reviewed including ED notes from 03/01/2019   Lab Tests:  None    Imaging Studies ordered:  I ordered imaging studies including ultrasound DVT study  and plain radiographs of the  lower left leg I independently visualized and interpreted imaging which showed no DVT and no fracture I agree with the radiologist interpretation   Medicines ordered and prescription drug management:  No medications ordered during the visit I have reviewed the patients home medicines and have made adjustments as needed   Test Considered:  None   Critical Interventions:  None   Consultations Obtained:  None    Reevaluation:  After the interventions noted above, I reevaluated the patient and found that they have :stayed the same   Social Determinants of Health:  None known   Dispostion:  After consideration of the diagnostic results and the patients response to treatment, I feel that the patent would benefit from discharge home with follow-up with primary care provider.  The patient has no neurologic deficits.  She is able to walk without difficulty.  Imaging was negative for fracture and negative for DVT.  I believe she may discharge home and use rest, ice, heat, compression, elevation as needed along with Tylenol and ibuprofen I see no reason at this time to consider hospital admission.  Return precautions were provided including sudden onset of chest pain or shortness of breath   Final Clinical Impression(s) / ED Diagnoses Final diagnoses:  Left leg pain    Rx / DC Orders ED Discharge Orders     None         Dorothyann Peng, [place] 03/26/21 2138    Lucrezia Starch, MD 03/29/21 [phone]

## 2021-04-12 ENCOUNTER — Other Ambulatory Visit: Payer: Self-pay | Admitting: *Deleted

## 2021-04-12 ENCOUNTER — Ambulatory Visit: Payer: 59 | Admitting: Internal Medicine

## 2021-04-12 ENCOUNTER — Telehealth: Payer: Self-pay | Admitting: *Deleted

## 2021-04-12 DIAGNOSIS — I10 Essential (primary) hypertension: Secondary | ICD-10-CM

## 2021-04-12 DIAGNOSIS — G8929 Other chronic pain: Secondary | ICD-10-CM

## 2021-04-12 MED ORDER — AMLODIPINE BESYLATE 5 MG PO TABS: 5.0000 mg | ORAL_TABLET | Freq: Every day | ORAL | 1 refills | Status: DC

## 2021-04-12 MED ORDER — ASPIRIN 81 MG PO TBEC: 81.0000 mg | DELAYED_RELEASE_TABLET | Freq: Every day | ORAL | 1 refills | Status: DC

## 2021-04-12 MED ORDER — MELOXICAM 15 MG PO TABS: 15.0000 mg | ORAL_TABLET | Freq: Every day | ORAL | 1 refills | Status: DC

## 2021-04-12 MED ORDER — EMPAGLIFLOZIN 25 MG PO TABS: 25.0000 mg | ORAL_TABLET | Freq: Every day | ORAL | 1 refills | Status: DC

## 2021-04-12 MED ORDER — LOSARTAN POTASSIUM-HCTZ 100-25 MG PO TABS: 1.0000 | ORAL_TABLET | Freq: Every day | ORAL | 1 refills | Status: DC

## 2021-04-12 MED ORDER — TRULICITY 1.5 MG/0.5ML ~~LOC~~ SOAJ: 1.5000 mg | SUBCUTANEOUS | 3 refills | Status: DC

## 2021-04-12 MED ORDER — TRESIBA FLEXTOUCH 200 UNIT/ML ~~LOC~~ SOPN: 90.0000 [IU] | PEN_INJECTOR | Freq: Every day | SUBCUTANEOUS | 1 refills | Status: DC

## 2021-04-12 NOTE — Telephone Encounter (Signed)
Patient needs a refill on  Trulicity Jardiance Tresiba  Please send into [facility].

## 2021-04-12 NOTE — Telephone Encounter (Signed)
Scripts have been sent

## 2021-04-13 ENCOUNTER — Other Ambulatory Visit (HOSPITAL_COMMUNITY): Payer: Self-pay

## 2021-04-13 ENCOUNTER — Telehealth: Payer: Self-pay

## 2021-04-13 NOTE — Telephone Encounter (Signed)
Patient Advocate Encounter   Received notification from [facility] that prior authorization for Trulicity 1.5mg /0.46ml pen injectors is required by his/her insurance [facility].   PA submitted on 04/13/21  Key#: [id number]  Status is pending    [facility] will continue to follow:  Patient Advocate Fax: [phone]

## 2021-07-27 ENCOUNTER — Emergency Department (HOSPITAL_COMMUNITY)
Admission: EM | Admit: 2021-07-27 | Discharge: 2021-07-27 | Disposition: A | Discharge status: Home / Self Care | Payer: 59 | Attending: Emergency Medicine | Admitting: Emergency Medicine

## 2021-07-27 ENCOUNTER — Encounter (HOSPITAL_COMMUNITY): Payer: Self-pay

## 2021-07-27 DIAGNOSIS — Z794 Long term (current) use of insulin: Secondary | ICD-10-CM | POA: Insufficient documentation

## 2021-07-27 DIAGNOSIS — Y92410 Unspecified street and highway as the place of occurrence of the external cause: Secondary | ICD-10-CM | POA: Diagnosis not present

## 2021-07-27 DIAGNOSIS — Z7982 Long term (current) use of aspirin: Secondary | ICD-10-CM | POA: Insufficient documentation

## 2021-07-27 DIAGNOSIS — R42 Dizziness and giddiness: Secondary | ICD-10-CM | POA: Diagnosis present

## 2021-07-27 DIAGNOSIS — Z91040 Latex allergy status: Secondary | ICD-10-CM | POA: Insufficient documentation

## 2021-07-27 DIAGNOSIS — Z79899 Other long term (current) drug therapy: Secondary | ICD-10-CM | POA: Insufficient documentation

## 2021-07-27 MED ORDER — ACETAMINOPHEN 325 MG PO TABS
650.0000 mg | ORAL_TABLET | Freq: Once | ORAL | Status: AC
  Administered 2021-07-27: 650 mg via ORAL
  Filled 2021-07-27: qty 2

## 2021-07-27 MED ORDER — METHOCARBAMOL 500 MG PO TABS: 500.0000 mg | ORAL_TABLET | Freq: Two times a day (BID) | ORAL | 0 refills | Status: DC
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
## 2021-07-27 NOTE — Discharge Instructions (Addendum)
It was a pleasure taking care of you today!  You will feel more sore in the morning. You are prescribed Robaxin (muscle relaxer). Do not drive or operate heavy machinery while taking the muscle relaxer. You may take over the counter 600 mg Ibuprofen every 6 hours or 1,000 mg Tylenol every 6 hours as needed for pain for no more than 7 days. You may apply ice or heat to affected area for up to 15 minutes at a time. Ensure to place a barrier between your skin and the ice/heat. Return to the Emergency Department if you are experiencing increasing/worsening  or worsening symptoms.
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
## 2021-07-30 ENCOUNTER — Encounter: Payer: Self-pay | Admitting: Family

## 2021-07-30 ENCOUNTER — Ambulatory Visit (INDEPENDENT_AMBULATORY_CARE_PROVIDER_SITE_OTHER): Payer: 59 | Admitting: Family

## 2021-07-30 VITALS — BP 110/50 | HR 89 | Resp 20 | Ht 61.0 in | Wt 205.2 lb

## 2021-07-30 DIAGNOSIS — E538 Deficiency of other specified B group vitamins: Secondary | ICD-10-CM

## 2021-07-30 DIAGNOSIS — R0683 Snoring: Secondary | ICD-10-CM

## 2021-07-30 DIAGNOSIS — G8929 Other chronic pain: Secondary | ICD-10-CM

## 2021-07-30 DIAGNOSIS — M25512 Pain in left shoulder: Secondary | ICD-10-CM

## 2021-07-30 DIAGNOSIS — R5383 Other fatigue: Secondary | ICD-10-CM

## 2021-07-30 DIAGNOSIS — E119 Type 2 diabetes mellitus without complications: Secondary | ICD-10-CM

## 2021-07-30 DIAGNOSIS — R519 Headache, unspecified: Secondary | ICD-10-CM

## 2021-07-30 DIAGNOSIS — Z794 Long term (current) use of insulin: Secondary | ICD-10-CM | POA: Diagnosis not present

## 2021-07-30 LAB — COMPREHENSIVE METABOLIC PANEL
ALT: 11 U/L (ref 0–35)
AST: 10 U/L (ref 0–37)
Albumin: 3.9 g/dL (ref 3.5–5.2)
Alkaline Phosphatase: 71 U/L (ref 39–117)
BUN: 20 mg/dL (ref 6–23)
CO2: 30 mEq/L (ref 19–32)
Calcium: 9.1 mg/dL (ref 8.4–10.5)
Chloride: 101 mEq/L (ref 96–112)
Creatinine, Ser: 1.06 mg/dL (ref 0.40–1.20)
GFR: 59.1 mL/min — ABNORMAL LOW (ref >60.00)
Glucose, Bld: 150 mg/dL — ABNORMAL HIGH (ref 70–99)
Potassium: 3.7 mEq/L (ref 3.5–5.1)
Sodium: 138 mEq/L (ref 135–145)
Total Bilirubin: 0.3 mg/dL (ref 0.2–1.2)
Total Protein: 7.6 g/dL (ref 6.0–8.3)

## 2021-07-30 LAB — CBC WITH DIFFERENTIAL/PLATELET
Basophils Absolute: 0.1 10*3/uL (ref 0.0–0.1)
Basophils Relative: 0.9 % (ref 0.0–3.0)
Eosinophils Absolute: 0.2 10*3/uL (ref 0.0–0.7)
Eosinophils Relative: 2.7 % (ref 0.0–5.0)
HCT: 36.3 % (ref 36.0–46.0)
Hemoglobin: 11.3 g/dL — ABNORMAL LOW (ref 12.0–15.0)
Lymphocytes Relative: 20.5 % (ref 12.0–46.0)
Lymphs Abs: 1.8 10*3/uL (ref 0.7–4.0)
MCHC: 30 g/dL (ref 30.0–36.0)
MCV: 70 fl — ABNORMAL LOW (ref 78.0–100.0)
Monocytes Absolute: 0.5 10*3/uL (ref 0.1–1.0)
Monocytes Relative: 6.3 % (ref 3.0–12.0)
Neutro Abs: 6 10*3/uL (ref 1.4–7.7)
Neutrophils Relative %: 69.6 % (ref 43.0–77.0)
Platelets: 452 10*3/uL — ABNORMAL HIGH (ref 150.0–400.0)
RBC: 5.38 Mil/uL — ABNORMAL HIGH (ref 3.87–5.11)
RDW: 17.1 % — ABNORMAL HIGH (ref 11.5–15.5)
WBC: 8.6 10*3/uL (ref 4.0–10.5)

## 2021-07-30 LAB — VITAMIN B12: Vitamin B-12: 483 pg/mL (ref 211–911)

## 2021-07-30 LAB — TSH: TSH: 0.64 u[IU]/mL (ref 0.35–5.50)

## 2021-07-30 LAB — HEMOGLOBIN A1C: Hgb A1c MFr Bld: 9.6 % — ABNORMAL HIGH (ref 4.6–6.5)

## 2021-07-30 MED ORDER — METHOCARBAMOL 500 MG PO TABS: 500.0000 mg | ORAL_TABLET | Freq: Two times a day (BID) | ORAL | 0 refills | Status: DC

## 2021-07-30 MED ORDER — EMPAGLIFLOZIN 25 MG PO TABS: 25.0000 mg | ORAL_TABLET | Freq: Every day | ORAL | 0 refills | Status: DC

## 2021-07-30 MED ORDER — INSULIN LISPRO (1 UNIT DIAL) 100 UNIT/ML (KWIKPEN): PEN_INJECTOR | SUBCUTANEOUS | 1 refills | Status: DC

## 2021-07-30 MED ORDER — MELOXICAM 15 MG PO TABS: 15.0000 mg | ORAL_TABLET | Freq: Every day | ORAL | 1 refills | Status: DC

## 2021-07-30 MED ORDER — PIOGLITAZONE HCL 30 MG PO TABS
30.0000 mg | ORAL_TABLET | Freq: Every day | ORAL | 0 refills | Status: DC
Start: 2021-07-30 — End: 2021-12-29

## 2021-07-30 NOTE — Patient Instructions (Signed)
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
## 2021-08-30 ENCOUNTER — Telehealth: Payer: Self-pay | Admitting: Pharmacy Technician

## 2021-08-30 NOTE — Telephone Encounter (Signed)
Patient Advocate Encounter   Received notification from CoverMyMeds that prior authorization for Dexcom G6 is due for renewal.   However, I can't tell that the pt is using the transmitter. Archiving requests for now.   Sensor Key: [id number] Key: [id number]

## 2021-08-31 ENCOUNTER — Telehealth (HOSPITAL_BASED_OUTPATIENT_CLINIC_OR_DEPARTMENT_OTHER): Payer: Self-pay

## 2021-09-23 ENCOUNTER — Other Ambulatory Visit: Payer: Self-pay | Admitting: Family

## 2021-09-23 DIAGNOSIS — G8929 Other chronic pain: Secondary | ICD-10-CM

## 2021-09-30 ENCOUNTER — Institutional Professional Consult (permissible substitution): Payer: 59 | Admitting: Neurology

## 2021-10-19 ENCOUNTER — Encounter: Payer: Self-pay | Admitting: Family

## 2021-10-20 NOTE — Telephone Encounter (Signed)
Patient daughter is aware that we currently still take this insurance.

## 2021-11-02 ENCOUNTER — Ambulatory Visit (INDEPENDENT_AMBULATORY_CARE_PROVIDER_SITE_OTHER): Payer: Commercial Managed Care - HMO | Admitting: Neurology

## 2021-11-02 ENCOUNTER — Encounter: Payer: Self-pay | Admitting: Neurology

## 2021-11-02 VITALS — BP 139/67 | HR 73 | Ht 64.0 in | Wt 215.6 lb

## 2021-11-02 DIAGNOSIS — R351 Nocturia: Secondary | ICD-10-CM

## 2021-11-02 DIAGNOSIS — E669 Obesity, unspecified: Secondary | ICD-10-CM | POA: Diagnosis not present

## 2021-11-02 DIAGNOSIS — R0683 Snoring: Secondary | ICD-10-CM | POA: Diagnosis not present

## 2021-11-02 DIAGNOSIS — Z9189 Other specified personal risk factors, not elsewhere classified: Secondary | ICD-10-CM

## 2021-11-02 DIAGNOSIS — G4719 Other hypersomnia: Secondary | ICD-10-CM | POA: Diagnosis not present

## 2021-11-02 NOTE — Progress Notes (Signed)
Subjective:    Patient ID: Michelle Mercer is a 56 y.o. female.  HPI    Star Age, MD, PhD [facility] [address], [address]. [place], [place] [address]  Dear Michelle Mercer,  I saw the patient, Michelle Mercer, upon your kind request in my sleep clinic today for initial consultation of her sleep disorder, in particular, concern for underlying obstructive sleep apnea.  The patient is unaccompanied today.  As you know, Michelle Mercer is a 56 year old right-handed woman with an underlying medical history of type 2 diabetes, anemia, chronic shoulder pain, vitamin D deficiency, deficiency, and obesity, who reports snoring and excessive daytime somnolence.  I reviewed your office note from 07/30/2021.  Her Epworth sleepiness score is 13 out of 24, fatigue severity score is 42 out of 63.  She works as a Freight forwarder.  She has woken up with a sense of choking at times.  This is typically when she sleeps on her back.  She has gained some weight over time.  Bedtime is generally variable, typically somewhere between 8 PM and midnight, rise time around 5 AM.  She is often awake in the early morning hours and has trouble going back to sleep.  She admits that she has utilized alcohol to help her sleep at night, she drinks about 4 ounces before bedtime, has also tried melatonin 10 mg, uses 1 or the other.  She has a TV on in her bedroom but typically not all night.  She drinks caffeine in the form of sweet tea, about 16 ounces in the mornings.  She drinks diet soda occasionally.  She is a non-smoker.  She lives with her older daughter and her grandson.  She has a 49 year old daughter who lives on her own.  They have no pets in the household.  She is not aware of any family history of sleep apnea.  Her father snored loudly.  She endorses nocturia about twice per average night, denies recurrent nocturnal or morning headaches.  Her Past Medical History Is Significant For: Past Medical History:   Diagnosis Date   Diabetes mellitus without complication ([place])    Hypertension    Iron deficiency anemia, unspecified 07/10/2012   Type II or unspecified type diabetes mellitus without mention of complication, not stated as uncontrolled 07/10/2012    Her Past Surgical History Is Significant For: Past Surgical History:  Procedure Laterality Date   BACK SURGERY     CESAREAN SECTION     CHOLECYSTECTOMY     TUBAL LIGATION      Her Family History Is Significant For: Family History  Problem Relation Age of Onset   Diabetes Mother    Hypertension Father    Heart disease Father    Diabetes Father    Asthma Sister    Asthma Sister     Her Social History Is Significant For: Social History   Socioeconomic History   Marital status: Single    Spouse name: Not on file   Number of children: Not on file   Years of education: Not on file   Highest education level: Not on file  Occupational History   Not on file  Tobacco Use   Smoking status: Never   Smokeless tobacco: Never  Vaping Use   Vaping Use: Never used  Substance and Sexual Activity   Alcohol use: Yes    Comment: occasional   Drug use: No   Sexual activity: Not on file  Other Topics Concern   Not  on file  Social History Narrative   Caffeine tea cold (1 cup daily).   Diet coke- 2 week   Education: early childhood dev Environmental manager)   Kindercare pre k .    Social Determinants of Health   Financial Resource Strain: Not on file  Food Insecurity: Not on file  Transportation Needs: Not on file  Physical Activity: Not on file  Stress: Not on file  Social Connections: Not on file    Her Allergies Are:  Allergies  Allergen Reactions   Iodine Itching   Lisinopril Cough and Nausea And Vomiting   Saxagliptin-Metformin Er Diarrhea   Lansoprazole Nausea Only and Rash   Latex Rash   Sulfamethoxazole-Trimethoprim Rash  :   Her Current Medications Are:  Outpatient Encounter Medications as of 11/02/2021  Medication Sig    albuterol (VENTOLIN HFA) 108 (90 Base) MCG/ACT inhaler Inhale 2 puffs into the lungs every 6 (six) hours as needed for wheezing.   amLODipine (NORVASC) 5 MG tablet Take 1 tablet (5 mg total) by mouth daily.   aspirin 81 MG EC tablet Take 1 tablet (81 mg total) by mouth daily.   Cholecalciferol (VITAMIN D-3) 125 MCG (5000 UT) TABS Take 1 tablet by mouth daily.   Continuous Blood Gluc Receiver (DEXCOM G6 RECEIVER) DEVI 1 Device by Does not apply route as directed. Change every 90 days   Continuous Blood Gluc Sensor (DEXCOM G6 SENSOR) MISC 1 Device by Does not apply route as directed. Change every 10 days   Continuous Blood Gluc Transmit (DEXCOM G6 TRANSMITTER) MISC 1 Device by Does not apply route as directed.   Cyanocobalamin (VITAMIN B 12 PO) Take by mouth every morning.   Dulaglutide (TRULICITY) 1.5 MG/0.5ML SOPN Inject 1.5 mg into the skin once a week.   empagliflozin (JARDIANCE) 25 MG TABS tablet Take 1 tablet (25 mg total) by mouth daily.   fluticasone (FLONASE) 50 MCG/ACT nasal spray Place 2 sprays into both nostrils daily.   glucose blood (PRECISION QID TEST) test strip Monitor BG three to four times daily, before meals and bedtime   insulin degludec (TRESIBA FLEXTOUCH) 200 UNIT/ML FlexTouch Pen Inject 90 Units into the skin daily in the afternoon.   insulin lispro (HUMALOG KWIKPEN) 100 UNIT/ML KwikPen Max daily 30 units per scale   Insulin Pen Needle (B-D ULTRAFINE III SHORT PEN) 31G X 8 MM MISC Inject 1 Device into the skin in the morning, at noon, in the evening, and at bedtime.   losartan-hydrochlorothiazide (HYZAAR) 100-25 MG tablet Take 1 tablet by mouth daily.   meloxicam (MOBIC) 15 MG tablet TAKE 1 TABLET BY MOUTH DAILY   methocarbamol (ROBAXIN) 500 MG tablet Take 1 tablet (500 mg total) by mouth 2 (two) times daily.   pantoprazole (PROTONIX) 40 MG tablet Take 1 tablet (40 mg total) by mouth daily.   pioglitazone (ACTOS) 30 MG tablet Take 1 tablet (30 mg total) by mouth daily.    [DISCONTINUED] Cholecalciferol 1.25 MG (50000 UT) TABS Take by mouth.   No facility-administered encounter medications on file as of 11/02/2021.  :   Review of Systems:  Out of a complete 14 point review of systems, all are reviewed and negative with the exception of these symptoms as listed below:   Review of Systems  Neurological:        Loud snoring, fatigue, BMI of 38.  Fragmented sleep.  Dozes off in conversations.  Not having energy at work. ESS 13,  FSS  42.     Objective:  Neurological Exam  Physical Exam Physical Examination:   Vitals:   11/02/21 1008  BP: 139/67  Pulse: 73    General Examination: The patient is a very pleasant 56 y.o. female in no acute distress. She appears well-developed and well-nourished and well groomed.   HEENT: Normocephalic, atraumatic, pupils are equal, round and reactive to light, extraocular tracking is good without limitation to gaze excursion or nystagmus noted. Hearing is grossly intact. Face is symmetric with normal facial animation. Speech is clear with no dysarthria noted. There is no hypophonia. There is no lip, neck/head, jaw or voice tremor. Neck is supple with full range of passive and active motion. There are no carotid bruits on auscultation. Oropharynx exam reveals: mild mouth dryness, adequate dental hygiene and moderate airway crowding, due to redundant soft palate, Mallampati class IV, tip of uvula and tonsils not fully visualized.  Neck circumference of 17-7/8 inches.  No significant overbite, skewed teeth alignment noted.  Chest: Clear to auscultation without wheezing, rhonchi or crackles noted.  Heart: S1+S2+0, regular and normal without murmurs, rubs or gallops noted.   Abdomen: Soft, non-tender and non-distended.  Extremities: There is no pitting edema in the distal lower extremities bilaterally.   Skin: Warm and dry without trophic changes noted.   Musculoskeletal: exam reveals no obvious joint deformities.    Neurologically:  Mental status: The patient is awake, alert and oriented in all 4 spheres. Her immediate and remote memory, attention, language skills and fund of knowledge are appropriate. There is no evidence of aphasia, agnosia, apraxia or anomia. Speech is clear with normal prosody and enunciation. Thought process is linear. Mood is normal and affect is normal.  Cranial nerves II - XII are as described above under HEENT exam.  Motor exam: Normal bulk, strength and tone is noted. There is no obvious tremor. Fine motor skills and coordination: grossly intact.  Cerebellar testing: No dysmetria or intention tremor. There is no truncal or gait ataxia.  Sensory exam: intact to light touch in the upper and lower extremities.  Gait, station and balance: She stands easily. No veering to one side is noted. No leaning to one side is noted. Posture is age-appropriate and stance is narrow based. Gait shows normal stride length and normal pace. No problems turning are noted.   Assessment and Plan:  In summary, Naela Nodal is a very pleasant 55 y.o.-year old female with an underlying medical history of type 2 diabetes, anemia, chronic shoulder pain, vitamin D deficiency, deficiency, and obesity, whose history and physical exam concerning for sleep disordered breathing, supporting a current working diagnosis of unspecified sleep apnea, with the main differential diagnoses of obstructive sleep apnea (OSA) versus upper airway resistance syndrome (UARS) versus central sleep apnea (CSA), or mixed sleep apnea. A laboratory attended sleep study is considered gold standard for evaluation of sleep disordered breathing and is recommended at this time and clinically justified.   I had a long chat with the patient about my findings and the diagnosis of sleep apnea, particularly OSA, its prognosis and treatment options. We talked about medical/conservative treatments, surgical interventions and non-pharmacological  approaches for symptom control. I explained, in particular, the risks and ramifications of untreated moderate to severe OSA, especially with respect to developing cardiovascular disease down the road, including congestive heart failure (CHF), difficult to treat hypertension, cardiac arrhythmias (particularly A-fib), neurovascular complications including TIA, stroke and dementia. Even type 2 diabetes has, in part, been linked to untreated OSA. Symptoms of untreated OSA may include (but  may not be limited to) daytime sleepiness, nocturia (i.e. frequent nighttime urination), memory problems, mood irritability and suboptimally controlled or worsening mood disorder such as depression and/or anxiety, lack of energy, lack of motivation, physical discomfort, as well as recurrent headaches, especially morning or nocturnal headaches. We talked about the importance of maintaining a healthy lifestyle and striving for healthy weight. In addition, we talked about the importance of striving for and maintaining good sleep hygiene.  She was strongly discouraged to utilize alcohol as a sleep aid.  In fact, she was advised that alcohol is known to be a sleep disrupter.  She can certainly continue to utilize melatonin at night to help her sleep. I recommended the following at this time: sleep study.  I outlined the differences between a laboratory attended sleep study which is considered more comprehensive and accurate over the option of a home sleep test (HST); the latter may lead to underestimation of sleep disordered breathing in some instances and does not help with diagnosing upper airway resistance syndrome and is not accurate enough to diagnose primary central sleep apnea typically. I explained the different sleep test procedures to the patient in detail and also outlined possible surgical and non-surgical treatment options of OSA, including the use of a pressure airway pressure (PAP) device (ie CPAP, AutoPAP/APAP or BiPAP in  certain circumstances), a custom-made dental device (aka oral appliance, which would require a referral to a specialist dentist or orthodontist typically, and is generally speaking not considered a good choice for patients with full dentures or edentulous state), upper airway surgical options, such as traditional UPPP (which is not considered a first-line treatment) or the Inspire device (hypoglossal nerve stimulator, which would involve a referral for consultation with an ENT surgeon, after careful selection, following inclusion criteria). I explained the PAP treatment option to the patient in detail, as this is generally considered first-line treatment.  The patient indicated that she would be willing to try PAP therapy, if the need arises. I explained the importance of being compliant with PAP treatment, not only for insurance purposes but primarily to improve patient's symptoms symptoms, and for the patient's long term health benefit, including to reduce Her cardiovascular risks longer-term.    We will pick up our discussion about the next steps and treatment options after testing.  We will keep her posted as to the test results by phone call and/or MyChart messaging where possible.  We will plan to follow-up in sleep clinic accordingly as well.  I answered all her questions today and the patient was in agreement.   I encouraged her to call with any interim questions, concerns, problems or updates or email us through MyChart.  Generally speaking, sleep test authorizations may take up to 2 weeks, sometimes less, sometimes longer, the patient is encouraged to get in touch with us if they do not hear back from the sleep lab staff directly within the next 2 weeks.  Thank you very much for allowing me to participate in the care of this nice patient. If I can be of any further assistance to you please do not hesitate to call me at [phone].  Sincerely,   Huston FoleySaima Zanyla Klebba, MD, PhD

## 2021-11-02 NOTE — Patient Instructions (Signed)

## 2021-11-16 ENCOUNTER — Other Ambulatory Visit: Payer: Self-pay | Admitting: Family

## 2021-11-16 DIAGNOSIS — G8929 Other chronic pain: Secondary | ICD-10-CM

## 2021-11-26 ENCOUNTER — Encounter: Payer: Self-pay | Admitting: Family

## 2021-11-26 ENCOUNTER — Other Ambulatory Visit: Payer: Self-pay | Admitting: Family

## 2021-11-26 ENCOUNTER — Ambulatory Visit (INDEPENDENT_AMBULATORY_CARE_PROVIDER_SITE_OTHER): Payer: Commercial Managed Care - HMO | Admitting: Family

## 2021-11-26 VITALS — BP 142/80 | HR 83 | Temp 98.1°F | Ht 61.0 in | Wt 216.2 lb

## 2021-11-26 DIAGNOSIS — E1165 Type 2 diabetes mellitus with hyperglycemia: Secondary | ICD-10-CM

## 2021-11-26 DIAGNOSIS — R0982 Postnasal drip: Secondary | ICD-10-CM | POA: Diagnosis not present

## 2021-11-26 DIAGNOSIS — J209 Acute bronchitis, unspecified: Secondary | ICD-10-CM | POA: Diagnosis not present

## 2021-11-26 DIAGNOSIS — R059 Cough, unspecified: Secondary | ICD-10-CM | POA: Diagnosis not present

## 2021-11-26 DIAGNOSIS — Z794 Long term (current) use of insulin: Secondary | ICD-10-CM

## 2021-11-26 MED ORDER — FLUTICASONE PROPIONATE 50 MCG/ACT NA SUSP: 2.0000 | Freq: Every day | NASAL | 6 refills | Status: DC

## 2021-11-26 MED ORDER — DOXYCYCLINE HYCLATE 100 MG PO TABS: 100.0000 mg | ORAL_TABLET | Freq: Two times a day (BID) | ORAL | 0 refills | Status: DC

## 2021-11-26 MED ORDER — PROMETHAZINE-DM 6.25-15 MG/5ML PO SYRP: 5.0000 mL | ORAL_SOLUTION | Freq: Four times a day (QID) | ORAL | 0 refills | Status: DC | PRN

## 2021-11-26 NOTE — Patient Instructions (Signed)
Please call and get scheduled to see your endocrinologist;

## 2021-11-26 NOTE — Progress Notes (Signed)
Michelle Mercer is a 56 y.o. female with the following history as recorded in [employer]:  Patient Active Problem List   Diagnosis Date Noted   Iron deficiency anemia, unspecified 07/10/2012   Type II or unspecified type diabetes mellitus without mention of complication, not stated as uncontrolled 07/10/2012    Current Outpatient Medications  Medication Sig Dispense Refill   albuterol (VENTOLIN HFA) 108 (90 Base) MCG/ACT inhaler Inhale 2 puffs into the lungs every 6 (six) hours as needed for wheezing. 18 g 0   amLODipine (NORVASC) 5 MG tablet Take 1 tablet (5 mg total) by mouth daily. 90 tablet 1   aspirin 81 MG EC tablet Take 1 tablet (81 mg total) by mouth daily. 90 tablet 1   Cholecalciferol (VITAMIN D-3) 125 MCG (5000 UT) TABS Take 1 tablet by mouth daily.     Cyanocobalamin (VITAMIN B 12 PO) Take by mouth every morning.     doxycycline (VIBRA-TABS) 100 MG tablet Take 1 tablet (100 mg total) by mouth 2 (two) times daily. 14 tablet 0   Dulaglutide (TRULICITY) 1.5 QV/9.5GL SOPN Inject 1.5 mg into the skin once a week. 6 mL 3   empagliflozin (JARDIANCE) 25 MG TABS tablet Take 1 tablet (25 mg total) by mouth daily. 90 tablet 0   fluticasone (FLONASE) 50 MCG/ACT nasal spray Place 2 sprays into both nostrils daily. 16 g 6   glucose blood (PRECISION QID TEST) test strip Monitor BG three to four times daily, before meals and bedtime     insulin degludec (TRESIBA FLEXTOUCH) 200 UNIT/ML FlexTouch Pen Inject 90 Units into the skin daily in the afternoon. 45 mL 1   insulin lispro (HUMALOG KWIKPEN) 100 UNIT/ML KwikPen Max daily 30 units per scale 30 mL 1   Insulin Pen Needle (B-D ULTRAFINE III SHORT PEN) 31G X 8 MM MISC Inject 1 Device into the skin in the morning, at noon, in the evening, and at bedtime. 400 each 3   losartan-hydrochlorothiazide (HYZAAR) 100-25 MG tablet Take 1 tablet by mouth daily. 90 tablet 1   meloxicam (MOBIC) 15 MG tablet TAKE ONE TABLET BY MOUTH ONE TIME DAILY 30 tablet 0    pioglitazone (ACTOS) 30 MG tablet Take 1 tablet (30 mg total) by mouth daily. 90 tablet 0   promethazine-dextromethorphan (PROMETHAZINE-DM) 6.25-15 MG/5ML syrup Take 5 mLs by mouth 4 (four) times daily as needed for cough. 118 mL 0   Continuous Blood Gluc Receiver (DEXCOM G6 RECEIVER) DEVI 1 Device by Does not apply route as directed. Change every 90 days (Patient not taking: Reported on 11/26/2021) 1 each 0   Continuous Blood Gluc Sensor (DEXCOM G6 SENSOR) MISC 1 Device by Does not apply route as directed. Change every 10 days (Patient not taking: Reported on 11/26/2021) 9 each 3   Continuous Blood Gluc Transmit (DEXCOM G6 TRANSMITTER) MISC 1 Device by Does not apply route as directed. (Patient not taking: Reported on 11/26/2021) 1 each 3   fluticasone (FLONASE) 50 MCG/ACT nasal spray Place 2 sprays into both nostrils daily. (Patient not taking: Reported on 11/26/2021) 16 g 1   No current facility-administered medications for this visit.    Allergies: Iodine, Lisinopril, Saxagliptin-metformin er, Lansoprazole, Latex, and Sulfamethoxazole-trimethoprim  Past Medical History:  Diagnosis Date   Diabetes mellitus without complication ([place])    Hypertension    Iron deficiency anemia, unspecified 07/10/2012   Type II or unspecified type diabetes mellitus without mention of complication, not stated as uncontrolled 07/10/2012    Past Surgical History:  Procedure Laterality Date   BACK SURGERY     CESAREAN SECTION     CHOLECYSTECTOMY     TUBAL LIGATION      Family History  Problem Relation Age of Onset   Diabetes Mother    Hypertension Father    Heart disease Father    Diabetes Father    Asthma Sister    Asthma Sister     Social History   Tobacco Use   Smoking status: Never   Smokeless tobacco: Never  Substance Use Topics   Alcohol use: Yes    Comment: occasional    Subjective:   Cough x 2-3 weeks; has been using OTC Claritin and albuterol; no fever; "feels deep in my chest." No  fever; notes that she has the recurrent episodes of persisting cough on and off throughout the year; similar symptoms in December 2022- had normal CXR at that time;      Objective:  Vitals:   11/26/21 1444  BP: (!) 142/80  Pulse: 83  Temp: 98.1 F (36.7 C)  TempSrc: Oral  SpO2: 98%  Weight: 216 lb 3.2 oz (98.1 kg)  Height: 5\' 1"  (1.549 m)    General: Well developed, well nourished, in no acute distress  Skin : Warm and dry.  Head: Normocephalic and atraumatic  Eyes: Sclera and conjunctiva clear; pupils round and reactive to light; extraocular movements intact  Ears: External normal; canals clear; tympanic membranes normal  Oropharynx: Pink, supple. No suspicious lesions  Neck: Supple without thyromegaly, adenopathy  Lungs: Respirations unlabored; clear to auscultation bilaterally without wheeze, rales, rhonchi  CVS exam: normal rate and regular rhythm.  Neurologic: Alert and oriented; speech intact; face symmetrical; moves all extremities well; CNII-XII intact without focal deficit   Assessment:  1. Post-nasal drainage   2. Cough, unspecified type   3. Acute bronchitis, unspecified organism   4. Type 2 diabetes mellitus with hyperglycemia, with long-term current use of insulin (HCC)     Plan:  Normal CXR in December 2022; suspect underlying uncontrolled allergies as source of recurrent episodes; encouraged to switch from Claritin to Allegra or Zyrtec; encouraged to use Flonase regularly all year; will treat for secondary infection with Doxycycline and Promethazine DM; increase fluids, rest; if symptoms persist, recur will re-consider updating CXR and/or treating for underlying asthma;  Stressed again the need to follow up with her endocrinologist;   Work note given as requested;   No follow-ups on file.  No orders of the defined types were placed in this encounter.   Requested Prescriptions   Signed Prescriptions Disp Refills   fluticasone (FLONASE) 50 MCG/ACT nasal  spray 16 g 6    Sig: Place 2 sprays into both nostrils daily.   doxycycline (VIBRA-TABS) 100 MG tablet 14 tablet 0    Sig: Take 1 tablet (100 mg total) by mouth 2 (two) times daily.   promethazine-dextromethorphan (PROMETHAZINE-DM) 6.25-15 MG/5ML syrup 118 mL 0    Sig: Take 5 mLs by mouth 4 (four) times daily as needed for cough.

## 2021-12-06 ENCOUNTER — Emergency Department (HOSPITAL_BASED_OUTPATIENT_CLINIC_OR_DEPARTMENT_OTHER): Payer: Commercial Managed Care - HMO

## 2021-12-06 ENCOUNTER — Other Ambulatory Visit: Payer: Self-pay

## 2021-12-06 ENCOUNTER — Emergency Department (HOSPITAL_BASED_OUTPATIENT_CLINIC_OR_DEPARTMENT_OTHER)
Admission: EM | Admit: 2021-12-06 | Discharge: 2021-12-06 | Disposition: A | Discharge status: Home / Self Care | Payer: Commercial Managed Care - HMO | Attending: Emergency Medicine | Admitting: Emergency Medicine

## 2021-12-06 ENCOUNTER — Encounter (HOSPITAL_BASED_OUTPATIENT_CLINIC_OR_DEPARTMENT_OTHER): Payer: Self-pay | Admitting: Emergency Medicine

## 2021-12-06 DIAGNOSIS — Z79899 Other long term (current) drug therapy: Secondary | ICD-10-CM | POA: Diagnosis not present

## 2021-12-06 DIAGNOSIS — Z794 Long term (current) use of insulin: Secondary | ICD-10-CM | POA: Diagnosis not present

## 2021-12-06 DIAGNOSIS — R059 Cough, unspecified: Secondary | ICD-10-CM | POA: Diagnosis present

## 2021-12-06 DIAGNOSIS — Z91040 Latex allergy status: Secondary | ICD-10-CM | POA: Diagnosis not present

## 2021-12-06 DIAGNOSIS — Z7982 Long term (current) use of aspirin: Secondary | ICD-10-CM | POA: Diagnosis not present

## 2021-12-06 DIAGNOSIS — Z7984 Long term (current) use of oral hypoglycemic drugs: Secondary | ICD-10-CM | POA: Diagnosis not present

## 2021-12-06 DIAGNOSIS — J069 Acute upper respiratory infection, unspecified: Secondary | ICD-10-CM | POA: Insufficient documentation

## 2021-12-06 DIAGNOSIS — R109 Unspecified abdominal pain: Secondary | ICD-10-CM | POA: Insufficient documentation

## 2021-12-06 LAB — CBC
HCT: 39 % (ref 36.0–46.0)
Hemoglobin: 11.5 g/dL — ABNORMAL LOW (ref 12.0–15.0)
MCH: 20.9 pg — ABNORMAL LOW (ref 26.0–34.0)
MCHC: 29.5 g/dL — ABNORMAL LOW (ref 30.0–36.0)
MCV: 70.9 fL — ABNORMAL LOW (ref 80.0–100.0)
Platelets: 545 10*3/uL — ABNORMAL HIGH (ref 150–400)
RBC: 5.5 MIL/uL — ABNORMAL HIGH (ref 3.87–5.11)
RDW: 17.5 % — ABNORMAL HIGH (ref 11.5–15.5)
WBC: 10.4 10*3/uL (ref 4.0–10.5)
nRBC: 0 % (ref 0.0–0.2)

## 2021-12-06 LAB — COMPREHENSIVE METABOLIC PANEL
ALT: 11 U/L (ref 0–44)
AST: 17 U/L (ref 15–41)
Albumin: 3.4 g/dL — ABNORMAL LOW (ref 3.5–5.0)
Alkaline Phosphatase: 70 U/L (ref 38–126)
Anion gap: 4 — ABNORMAL LOW (ref 5–15)
BUN: 15 mg/dL (ref 6–20)
CO2: 27 mmol/L (ref 22–32)
Calcium: 8.2 mg/dL — ABNORMAL LOW (ref 8.9–10.3)
Chloride: 106 mmol/L (ref 98–111)
Creatinine, Ser: 0.92 mg/dL (ref 0.44–1.00)
GFR, Estimated: >60 mL/min (ref >60)
Glucose, Bld: 125 mg/dL — ABNORMAL HIGH (ref 70–99)
Potassium: 4.4 mmol/L (ref 3.5–5.1)
Sodium: 137 mmol/L (ref 135–145)
Total Bilirubin: 0.5 mg/dL (ref 0.3–1.2)
Total Protein: 7.6 g/dL (ref 6.5–8.1)

## 2021-12-06 LAB — LIPASE, BLOOD: Lipase: 86 U/L — ABNORMAL HIGH (ref 11–51)

## 2021-12-06 MED ORDER — BENZONATATE 100 MG PO CAPS: 100.0000 mg | ORAL_CAPSULE | Freq: Three times a day (TID) | ORAL | 0 refills | Status: DC

## 2021-12-06 MED ORDER — ONDANSETRON 4 MG PO TBDP: ORAL_TABLET | ORAL | 0 refills | Status: DC

## 2021-12-06 NOTE — Discharge Instructions (Addendum)
Your chest x-ray did not show pneumonia.  One of your tests is called lipase that used to rule out pancreatitis was high but not so high that we would say that you obviously have that problem.  Please return for worsening abdominal pain especially with eating fever or inability to eat or drink.  Take tylenol 2 pills 4 times a day and motrin 4 pills 3 times a day.  Drink plenty of fluids.  Return for worsening shortness of breath, headache, confusion. Follow up with your family doctor.

## 2021-12-06 NOTE — ED Triage Notes (Signed)
Sudden onset R flank pain that wraps to abdomen since yesterday. Hx of kidney stones but pt states this does not feel the same. Pt also mentions a cough but states that she has already seen her PCP for that and was told "its allergies". No urinary complaints, fever, or other sx.

## 2021-12-06 NOTE — ED Provider Notes (Signed)
[facility] Provider Note   CSN: [id number] Arrival date & time: 12/06/21  9937     History  Chief Complaint  Patient presents with   Flank Pain    Michelle Mercer is a 56 y.o. female.  56 yo F with a chief complaints of right-sided pain.  This been going on for couple days.  Worse with coughing and deep breathing.  Has been coughing quite a bit for the past few days.  Was seen by her doctor and told that it was likely allergies.  Since then the cough is persisted and developed this discomfort.  Denies any worsening with eating or drinking.  No noted fevers at home.   Flank Pain       Home Medications Prior to Admission medications   Medication Sig Start Date End Date Taking? Authorizing Provider  benzonatate (TESSALON) 100 MG capsule Take 1 capsule (100 mg total) by mouth every 8 (eight) hours. 12/06/21  Yes Deno Etienne, DO  ondansetron (ZOFRAN-ODT) 4 MG disintegrating tablet 4mg  ODT q4 hours prn nausea/vomit 12/06/21  Yes Deno Etienne, DO  albuterol (VENTOLIN HFA) 108 (90 Base) MCG/ACT inhaler Inhale 2 puffs into the lungs every 6 (six) hours as needed for wheezing. 01/14/21   Saguier, Percell Miller, PA-C  amLODipine (NORVASC) 5 MG tablet Take 1 tablet (5 mg total) by mouth daily. 04/12/21   Marrian Salvage, FNP  aspirin 81 MG EC tablet Take 1 tablet (81 mg total) by mouth daily. 04/12/21   Marrian Salvage, FNP  Cholecalciferol (VITAMIN D-3) 125 MCG (5000 UT) TABS Take 1 tablet by mouth daily.    [provider]  Continuous Blood Gluc Receiver (DEXCOM G6 RECEIVER) DEVI 1 Device by Does not apply route as directed. Change every 90 days Patient not taking: Reported on 11/26/2021 09/04/20   Shamleffer, Melanie Crazier, MD  Continuous Blood Gluc Sensor (DEXCOM G6 SENSOR) MISC 1 Device by Does not apply route as directed. Change every 10 days Patient not taking: Reported on 11/26/2021 09/04/20   Shamleffer, Melanie Crazier, MD  Continuous  Blood Gluc Transmit (DEXCOM G6 TRANSMITTER) MISC 1 Device by Does not apply route as directed. Patient not taking: Reported on 11/26/2021 09/04/20   Shamleffer, Melanie Crazier, MD  Cyanocobalamin (VITAMIN B 12 PO) Take by mouth every morning.    [provider]  doxycycline (VIBRA-TABS) 100 MG tablet Take 1 tablet (100 mg total) by mouth 2 (two) times daily. 11/26/21   Marrian Salvage, FNP  Dulaglutide (TRULICITY) 1.5 JI/9.6VE SOPN Inject 1.5 mg into the skin once a week. 04/12/21   Shamleffer, Melanie Crazier, MD  empagliflozin (JARDIANCE) 25 MG TABS tablet Take 1 tablet (25 mg total) by mouth daily. 07/30/21   Marrian Salvage, FNP  fluticasone (FLONASE) 50 MCG/ACT nasal spray Place 2 sprays into both nostrils daily. Patient not taking: Reported on 11/26/2021 01/14/21   Saguier, Percell Miller, PA-C  fluticasone [facility]) 50 MCG/ACT nasal spray Place 2 sprays into both nostrils daily. 11/26/21   Marrian Salvage, FNP  glucose blood (PRECISION QID TEST) test strip Monitor BG three to four times daily, before meals and bedtime 06/10/15   [provider]  insulin degludec (TRESIBA FLEXTOUCH) 200 UNIT/ML FlexTouch Pen Inject 90 Units into the skin daily in the afternoon. 04/12/21   Shamleffer, Melanie Crazier, MD  insulin lispro (HUMALOG KWIKPEN) 100 UNIT/ML KwikPen Max daily 30 units per scale 07/30/21   Marrian Salvage, FNP  Insulin Pen Needle (B-D ULTRAFINE III SHORT  PEN) 31G X 8 MM MISC Inject 1 Device into the skin in the morning, at noon, in the evening, and at bedtime. 08/11/20   Shamleffer, Konrad Dolores, MD  losartan-hydrochlorothiazide (HYZAAR) 100-25 MG tablet Take 1 tablet by mouth daily. 04/12/21   Olive Bass, FNP  meloxicam [facility]) 15 MG tablet TAKE ONE TABLET BY MOUTH ONE TIME DAILY 11/17/21   Olive Bass, FNP  pioglitazone (ACTOS) 30 MG tablet Take 1 tablet (30 mg total) by mouth daily. 07/30/21   Olive Bass, FNP   promethazine-dextromethorphan (PROMETHAZINE-DM) 6.25-15 MG/5ML syrup Take 5 mLs by mouth 4 (four) times daily as needed for cough. 11/26/21   Olive Bass, FNP      Allergies    Iodine, Lisinopril, Saxagliptin-metformin er, Lansoprazole, Latex, and Sulfamethoxazole-trimethoprim    Review of Systems   Review of Systems  Genitourinary:  Positive for flank pain.    Physical Exam Updated Vital Signs BP (!) 170/62 (BP Location: Left Arm)   Pulse 75   Temp 98.2 F (36.8 C) (Oral)   Resp 20   Ht 5\' 1"  (1.549 m)   Wt 99.8 kg   SpO2 100%   BMI 41.57 kg/m  Physical Exam Vitals and nursing note reviewed.  Constitutional:      General: She is not in acute distress.    Appearance: She is well-developed. She is not diaphoretic.  HENT:     Head: Normocephalic and atraumatic.  Eyes:     Pupils: Pupils are equal, round, and reactive to light.  Cardiovascular:     Rate and Rhythm: Normal rate and regular rhythm.     Heart sounds: No murmur heard.    No friction rub. No gallop.  Pulmonary:     Effort: Pulmonary effort is normal.     Breath sounds: No wheezing or rales.  Chest:     Comments: Pain with palpation of the right anterior chest wall.  No rash. Abdominal:     General: There is no distension.     Palpations: Abdomen is soft.     Tenderness: There is no abdominal tenderness.     Comments: Some mild discomfort in the epigastrium and right upper quadrant but pain is much worse with palpation of the ribs.  Musculoskeletal:        General: No tenderness.     Cervical back: Normal range of motion and neck supple.  Skin:    General: Skin is warm and dry.  Neurological:     Mental Status: She is alert and oriented to person, place, and time.  Psychiatric:        Behavior: Behavior normal.     ED Results / Procedures / Treatments   Labs (all labs ordered are listed, but only abnormal results are displayed) Labs Reviewed  LIPASE, BLOOD - Abnormal; Notable for the  following components:      Result Value   Lipase 86 (*)    All other components within normal limits  COMPREHENSIVE METABOLIC PANEL - Abnormal; Notable for the following components:   Glucose, Bld 125 (*)    Calcium 8.2 (*)    Albumin 3.4 (*)    Anion gap 4 (*)    All other components within normal limits  CBC - Abnormal; Notable for the following components:   RBC 5.50 (*)    Hemoglobin 11.5 (*)    MCV 70.9 (*)    MCH 20.9 (*)    MCHC 29.5 (*)    RDW 17.5 (*)  Platelets 545 (*)    All other components within normal limits  URINALYSIS, ROUTINE W REFLEX MICROSCOPIC    EKG None  Radiology DG Chest Port 1 View  Result Date: 12/06/2021 CLINICAL DATA:  Cough and chest pain EXAM: PORTABLE CHEST 1 VIEW COMPARISON:  Chest x-ray dated January 14, 2021 FINDINGS: The heart size and mediastinal contours are within normal limits. Mild linear opacities of the lower left lung, likely due to scarring or atelectasis. Both lungs are otherwise clear. The visualized skeletal structures are unremarkable. IMPRESSION: No active disease. Electronically Signed   By: Allegra Lai M.D.   On: 12/06/2021 08:10    Procedures Procedures    Medications Ordered in ED Medications - No data to display  ED Course/ Medical Decision Making/ A&P                           Medical Decision Making Amount and/or Complexity of Data Reviewed Labs: ordered. Radiology: ordered.  Risk Prescription drug management.   55 yo F with a chief complaints of right-sided chest pain.  Worse with coughing.  Going on for a couple days.  Reproduced on exam.  Likely musculoskeletal.  Is having some nausea and vomiting.  Will obtain a laboratory evaluation to assess for hepatitis, pancreatitis.  Chest x-ray.  Reassess.   Patient's blood work with trivial elevation of lipase.  LFTs unremarkable.  CBC without leukocytosis or anemia.  Chest x-ray independently interpreted by me without focal infiltrate or pneumothorax.  We  will treat as an upper respiratory illness.  PCP follow-up.  8:31 AM:  I have discussed the diagnosis/risks/treatment options with the patient.  Evaluation and diagnostic testing in the emergency department does not suggest an emergent condition requiring admission or immediate intervention beyond what has been performed at this time.  They will follow up with PCP. We also discussed returning to the ED immediately if new or worsening sx occur. We discussed the sx which are most concerning (e.g., sudden worsening pain, fever, inability to tolerate by mouth) that necessitate immediate return. Medications administered to the patient during their visit and any new prescriptions provided to the patient are listed below.  Medications given during this visit Medications - No data to display   The patient appears reasonably screen and/or stabilized for discharge and I doubt any other medical condition or other [facility] requiring further screening, evaluation, or treatment in the ED at this time prior to discharge.         Final Clinical Impression(s) / ED Diagnoses Final diagnoses:  Viral upper respiratory illness    Rx / DC Orders ED Discharge Orders          Ordered    ondansetron (ZOFRAN-ODT) 4 MG disintegrating tablet        12/06/21 0829    benzonatate (TESSALON) 100 MG capsule  Every 8 hours        12/06/21 0829              Melene Plan, DO 12/06/21 0831

## 2021-12-07 ENCOUNTER — Other Ambulatory Visit (HOSPITAL_COMMUNITY): Payer: Self-pay

## 2021-12-07 ENCOUNTER — Telehealth: Payer: Self-pay

## 2021-12-07 ENCOUNTER — Emergency Department (HOSPITAL_BASED_OUTPATIENT_CLINIC_OR_DEPARTMENT_OTHER): Payer: Commercial Managed Care - HMO

## 2021-12-07 ENCOUNTER — Encounter (HOSPITAL_COMMUNITY): Payer: Self-pay

## 2021-12-07 ENCOUNTER — Emergency Department (HOSPITAL_COMMUNITY)
Admission: EM | Admit: 2021-12-07 | Discharge: 2021-12-07 | Discharge status: Against Medical Advice | Payer: Commercial Managed Care - HMO | Attending: Emergency Medicine | Admitting: Emergency Medicine

## 2021-12-07 ENCOUNTER — Other Ambulatory Visit: Payer: Self-pay

## 2021-12-07 ENCOUNTER — Emergency Department (HOSPITAL_BASED_OUTPATIENT_CLINIC_OR_DEPARTMENT_OTHER)
Admission: EM | Admit: 2021-12-07 | Discharge: 2021-12-07 | Disposition: A | Discharge status: Home / Self Care | Payer: Commercial Managed Care - HMO | Source: Home / Self Care | Attending: Emergency Medicine | Admitting: Emergency Medicine

## 2021-12-07 DIAGNOSIS — Z91040 Latex allergy status: Secondary | ICD-10-CM | POA: Insufficient documentation

## 2021-12-07 DIAGNOSIS — I10 Essential (primary) hypertension: Secondary | ICD-10-CM | POA: Insufficient documentation

## 2021-12-07 DIAGNOSIS — R112 Nausea with vomiting, unspecified: Secondary | ICD-10-CM | POA: Insufficient documentation

## 2021-12-07 DIAGNOSIS — R748 Abnormal levels of other serum enzymes: Secondary | ICD-10-CM | POA: Diagnosis not present

## 2021-12-07 DIAGNOSIS — Z794 Long term (current) use of insulin: Secondary | ICD-10-CM | POA: Insufficient documentation

## 2021-12-07 DIAGNOSIS — Z5321 Procedure and treatment not carried out due to patient leaving prior to being seen by health care provider: Secondary | ICD-10-CM | POA: Diagnosis not present

## 2021-12-07 DIAGNOSIS — R1013 Epigastric pain: Secondary | ICD-10-CM | POA: Insufficient documentation

## 2021-12-07 DIAGNOSIS — E119 Type 2 diabetes mellitus without complications: Secondary | ICD-10-CM | POA: Insufficient documentation

## 2021-12-07 DIAGNOSIS — Z7982 Long term (current) use of aspirin: Secondary | ICD-10-CM | POA: Insufficient documentation

## 2021-12-07 DIAGNOSIS — Z79899 Other long term (current) drug therapy: Secondary | ICD-10-CM | POA: Insufficient documentation

## 2021-12-07 LAB — URINALYSIS, ROUTINE W REFLEX MICROSCOPIC
Bilirubin Urine: NEGATIVE
Glucose, UA: >1000 mg/dL — AB
Hgb urine dipstick: NEGATIVE
Leukocytes,Ua: NEGATIVE
Nitrite: NEGATIVE
Specific Gravity, Urine: 1.019 (ref 1.005–1.030)
pH: 5 (ref 5.0–8.0)

## 2021-12-07 LAB — COMPREHENSIVE METABOLIC PANEL
ALT: 12 U/L (ref 0–44)
AST: 14 U/L — ABNORMAL LOW (ref 15–41)
Albumin: 3.6 g/dL (ref 3.5–5.0)
Alkaline Phosphatase: 64 U/L (ref 38–126)
Anion gap: 8 (ref 5–15)
BUN: 16 mg/dL (ref 6–20)
CO2: 27 mmol/L (ref 22–32)
Calcium: 8.9 mg/dL (ref 8.9–10.3)
Chloride: 105 mmol/L (ref 98–111)
Creatinine, Ser: 1.07 mg/dL — ABNORMAL HIGH (ref 0.44–1.00)
GFR, Estimated: >60 mL/min (ref >60)
Glucose, Bld: 108 mg/dL — ABNORMAL HIGH (ref 70–99)
Potassium: 3.9 mmol/L (ref 3.5–5.1)
Sodium: 140 mmol/L (ref 135–145)
Total Bilirubin: 0.5 mg/dL (ref 0.3–1.2)
Total Protein: 7.8 g/dL (ref 6.5–8.1)

## 2021-12-07 LAB — CBC WITH DIFFERENTIAL/PLATELET
Abs Immature Granulocytes: 0.07 10*3/uL (ref 0.00–0.07)
Basophils Absolute: 0.1 10*3/uL (ref 0.0–0.1)
Basophils Relative: 1 %
Eosinophils Absolute: 0.5 10*3/uL (ref 0.0–0.5)
Eosinophils Relative: 5 %
HCT: 41.8 % (ref 36.0–46.0)
Hemoglobin: 12.2 g/dL (ref 12.0–15.0)
Immature Granulocytes: 1 %
Lymphocytes Relative: 26 %
Lymphs Abs: 2.5 10*3/uL (ref 0.7–4.0)
MCH: 21 pg — ABNORMAL LOW (ref 26.0–34.0)
MCHC: 29.2 g/dL — ABNORMAL LOW (ref 30.0–36.0)
MCV: 72.1 fL — ABNORMAL LOW (ref 80.0–100.0)
Monocytes Absolute: 0.7 10*3/uL (ref 0.1–1.0)
Monocytes Relative: 7 %
Neutro Abs: 5.7 10*3/uL (ref 1.7–7.7)
Neutrophils Relative %: 60 %
Platelets: 563 10*3/uL — ABNORMAL HIGH (ref 150–400)
RBC: 5.8 MIL/uL — ABNORMAL HIGH (ref 3.87–5.11)
RDW: 18.5 % — ABNORMAL HIGH (ref 11.5–15.5)
WBC: 9.6 10*3/uL (ref 4.0–10.5)
nRBC: 0 % (ref 0.0–0.2)

## 2021-12-07 LAB — LIPASE, BLOOD
Lipase: 42 U/L (ref 11–51)
Lipase: 23 U/L (ref 11–51)

## 2021-12-07 LAB — CBG MONITORING, ED: Glucose-Capillary: 79 mg/dL (ref 70–99)

## 2021-12-07 LAB — TROPONIN I (HIGH SENSITIVITY)
Troponin I (High Sensitivity): 3 ng/L (ref <18)
Troponin I (High Sensitivity): 3 ng/L (ref <18)

## 2021-12-07 MED ORDER — FAMOTIDINE IN NACL 20-0.9 MG/50ML-% IV SOLN
INTRAVENOUS | Status: AC
  Filled 2021-12-07: qty 50

## 2021-12-07 MED ORDER — HYDROCODONE-ACETAMINOPHEN 5-325 MG PO TABS
ORAL_TABLET | ORAL | Status: AC
  Filled 2021-12-07: qty 1

## 2021-12-07 MED ORDER — HYDROCODONE-ACETAMINOPHEN 5-325 MG PO TABS
1.0000 | ORAL_TABLET | Freq: Once | ORAL | Status: AC
  Administered 2021-12-07: 1 via ORAL

## 2021-12-07 MED ORDER — ALUM & MAG HYDROXIDE-SIMETH 200-200-20 MG/5ML PO SUSP
30.0000 mL | Freq: Once | ORAL | Status: AC
  Administered 2021-12-07: 30 mL via ORAL
  Filled 2021-12-07: qty 30

## 2021-12-07 MED ORDER — FENTANYL CITRATE PF 50 MCG/ML IJ SOSY
25.0000 ug | PREFILLED_SYRINGE | Freq: Once | INTRAMUSCULAR | Status: AC
  Administered 2021-12-07: 25 ug via INTRAVENOUS
  Filled 2021-12-07: qty 1

## 2021-12-07 MED ORDER — FAMOTIDINE 20 MG PO TABS: 20.0000 mg | ORAL_TABLET | Freq: Two times a day (BID) | ORAL | 0 refills | Status: DC

## 2021-12-07 MED ORDER — FAMOTIDINE IN NACL 20-0.9 MG/50ML-% IV SOLN
20.0000 mg | Freq: Once | INTRAVENOUS | Status: AC
  Administered 2021-12-07: 20 mg via INTRAVENOUS

## 2021-12-07 MED ORDER — IOHEXOL 300 MG/ML  SOLN
100.0000 mL | Freq: Once | INTRAMUSCULAR | Status: AC | PRN
  Administered 2021-12-07: 80 mL via INTRAVENOUS

## 2021-12-07 NOTE — ED Notes (Signed)
Patient to CT via wheelchair. Per Dr. Laverta Baltimore no premedication needed for iodine allergy. Patient declines any allergies to IV contrast specifically.

## 2021-12-07 NOTE — ED Provider Triage Note (Signed)
Emergency Medicine Provider Triage Evaluation Note  Michelle Mercer , a 56 y.o. female  was evaluated in triage.  Pt complains of epigastric pain.  Radiates to the right and into her back.  Was seen a few days ago for similar.  No chest pain or shortness of breath.  She had some nausea.  No chronic NSAID use, EtOH use.  No bloody stool.  Had a mildly elevated lipase at previous visit.  No imaging of abdomen performed per patient.  Prior cholecystectomy  Review of Systems  Positive: Abd pain Negative: Fever, cp, sob  Physical Exam  BP (!) 189/91   Pulse 78   Temp 97.9 F (36.6 C)   Resp 18   Ht 5\' 1"  (1.549 m)   Wt 95.3 kg   BMI 39.68 kg/m  Gen:   Awake, no distress   Resp:  Normal effort  MSK:   Moves extremities without difficulty  ABD:  Epigastric tenderness into right upper abdomen Other:    Medical Decision Making  Medically screening exam initiated at 11:47 AM.  Appropriate orders placed.  Michelle Mercer was informed that the remainder of the evaluation will be completed by another provider, this initial triage assessment does not replace that evaluation, and the importance of remaining in the ED until their evaluation is complete.  Abd pain, nausea   Sharonlee Nine A, PA-C 12/07/21 1225

## 2021-12-07 NOTE — ED Triage Notes (Signed)
Patient reports that she began having mid upper abdominal pain that wraps around to the mid back yesterday.  Patient states she has had 2 episodes last night and this AM where she felt like she was going to pass out. Patient was seen at [facility] yesterday for the same. Patient called her PCP today and was told to come to the ED for a CT Scan.

## 2021-12-07 NOTE — Discharge Instructions (Signed)
Stop taking your meloxicam daily.  Take Maalox as needed for nausea, vomiting abdominal pain.  If pain is severe, or intractable nausea vomiting please return to the ER.

## 2021-12-07 NOTE — ED Triage Notes (Signed)
Pt arrives POV with c/o 2 day history of mid upper abdominal pain that radiates around to her mid back.  She says the pain is worse after she eats.  She reports nausea and vomiting last night and today, as well as dizziness.  Was seen at [facility] yesterday and went to [facility] ED today but left without being seen.  She says EKG and blood work were done at [facility].  Pt ambulatory to triage, in NAD.

## 2021-12-07 NOTE — ED Provider Notes (Signed)
[facility] EMERGENCY DEPT Provider Note   CSN: [id number] Arrival date & time: 12/07/21  1744     History  Chief Complaint  Patient presents with   Abdominal Pain    Michelle Mercer is a 56 y.o. female, hx of HTN, DMII, who presents to the ED, secondary to epigastric discomfort radiating to the back for the last 3 days.  States that the pain is worse after eating, is associated with nausea and vomiting.  Has vomited 4-5 times in the past day.  Denies any lower abdominal pain, vaginal discharge, sick contacts.  No shortness of breath or cough.  States the pain is dull and achy at times, and then stabbing at other times.  Has taken a Tylenol and ibuprofen without relief.  Does endorse taking meloxicam daily is not on any Pepcid.  Also endorses some urinary frequency.    Home Medications Prior to Admission medications   Medication Sig Start Date End Date Taking? Authorizing Provider  famotidine (PEPCID) 20 MG tablet Take 1 tablet (20 mg total) by mouth 2 (two) times daily. 12/07/21  Yes Vinie Charity L, PA  albuterol (VENTOLIN HFA) 108 (90 Base) MCG/ACT inhaler Inhale 2 puffs into the lungs every 6 (six) hours as needed for wheezing. 01/14/21   Saguier, Ramon Dredge, PA-C  amLODipine (NORVASC) 5 MG tablet Take 1 tablet (5 mg total) by mouth daily. 04/12/21   Olive Bass, FNP  aspirin 81 MG EC tablet Take 1 tablet (81 mg total) by mouth daily. 04/12/21   Olive Bass, FNP  benzonatate (TESSALON) 100 MG capsule Take 1 capsule (100 mg total) by mouth every 8 (eight) hours. 12/06/21   Melene Plan, DO  Cholecalciferol (VITAMIN D-3) 125 MCG (5000 UT) TABS Take 1 tablet by mouth daily.    [provider]  Continuous Blood Gluc Receiver (DEXCOM G6 RECEIVER) DEVI 1 Device by Does not apply route as directed. Change every 90 days Patient not taking: Reported on 11/26/2021 09/04/20   Shamleffer, Konrad Dolores, MD  Continuous Blood Gluc Sensor (DEXCOM G6 SENSOR)  MISC 1 Device by Does not apply route as directed. Change every 10 days Patient not taking: Reported on 11/26/2021 09/04/20   Shamleffer, Konrad Dolores, MD  Continuous Blood Gluc Transmit (DEXCOM G6 TRANSMITTER) MISC 1 Device by Does not apply route as directed. Patient not taking: Reported on 11/26/2021 09/04/20   Shamleffer, Konrad Dolores, MD  Cyanocobalamin (VITAMIN B 12 PO) Take by mouth every morning.    [provider]  doxycycline (VIBRA-TABS) 100 MG tablet Take 1 tablet (100 mg total) by mouth 2 (two) times daily. 11/26/21   Olive Bass, FNP  Dulaglutide (TRULICITY) 1.5 MG/0.5ML SOPN Inject 1.5 mg into the skin once a week. 04/12/21   Shamleffer, Konrad Dolores, MD  empagliflozin (JARDIANCE) 25 MG TABS tablet Take 1 tablet (25 mg total) by mouth daily. 07/30/21   Olive Bass, FNP  fluticasone (FLONASE) 50 MCG/ACT nasal spray Place 2 sprays into both nostrils daily. Patient not taking: Reported on 11/26/2021 01/14/21   Saguier, Ramon Dredge, PA-C  fluticasone [facility]) 50 MCG/ACT nasal spray Place 2 sprays into both nostrils daily. 11/26/21   Olive Bass, FNP  glucose blood (PRECISION QID TEST) test strip Monitor BG three to four times daily, before meals and bedtime 06/10/15   [provider]  insulin degludec (TRESIBA FLEXTOUCH) 200 UNIT/ML FlexTouch Pen Inject 90 Units into the skin daily in the afternoon. 04/12/21   Shamleffer, Konrad Dolores, MD  insulin  lispro (HUMALOG KWIKPEN) 100 UNIT/ML KwikPen Max daily 30 units per scale 07/30/21   Olive Bass, FNP  Insulin Pen Needle (B-D ULTRAFINE III SHORT PEN) 31G X 8 MM MISC Inject 1 Device into the skin in the morning, at noon, in the evening, and at bedtime. 08/11/20   Shamleffer, Konrad Dolores, MD  losartan-hydrochlorothiazide (HYZAAR) 100-25 MG tablet Take 1 tablet by mouth daily. 04/12/21   Olive Bass, FNP  meloxicam (MOBIC) 15 MG tablet TAKE ONE TABLET BY MOUTH ONE TIME  DAILY 11/17/21   Olive Bass, FNP  ondansetron (ZOFRAN-ODT) 4 MG disintegrating tablet 4mg  ODT q4 hours prn nausea/vomit 12/06/21   12/08/21, DO  pioglitazone (ACTOS) 30 MG tablet Take 1 tablet (30 mg total) by mouth daily. 07/30/21   08/01/21, FNP  promethazine-dextromethorphan (PROMETHAZINE-DM) 6.25-15 MG/5ML syrup Take 5 mLs by mouth 4 (four) times daily as needed for cough. 11/26/21   11/28/21, FNP      Allergies    Iodine, Lisinopril, Saxagliptin-metformin er, Lansoprazole, Latex, and Sulfamethoxazole-trimethoprim    Review of Systems   Review of Systems  Constitutional:  Negative for chills and fever.  Respiratory:  Negative for cough and shortness of breath.   Gastrointestinal:  Positive for abdominal pain, nausea and vomiting. Negative for constipation and diarrhea.  Genitourinary:  Positive for frequency.    Physical Exam Updated Vital Signs BP (!) 155/84   Pulse 91   Temp 98.6 F (37 C) (Oral)   Resp 17   SpO2 98%  Physical Exam Vitals and nursing note reviewed.  Constitutional:      General: She is not in acute distress.    Appearance: She is well-developed.  HENT:     Head: Normocephalic and atraumatic.  Eyes:     Conjunctiva/sclera: Conjunctivae normal.  Cardiovascular:     Rate and Rhythm: Normal rate and regular rhythm.     Heart sounds: No murmur heard. Pulmonary:     Effort: Pulmonary effort is normal. No respiratory distress.     Breath sounds: Normal breath sounds.  Abdominal:     General: Abdomen is flat.     Palpations: Abdomen is soft.     Tenderness: There is abdominal tenderness in the epigastric area. There is no guarding or rebound.  Musculoskeletal:        General: No swelling.     Cervical back: Neck supple.  Skin:    General: Skin is warm and dry.     Capillary Refill: Capillary refill takes less than 2 seconds.  Neurological:     Mental Status: She is alert.  Psychiatric:        Mood and Affect:  Mood normal.     ED Results / Procedures / Treatments   Labs (all labs ordered are listed, but only abnormal results are displayed) Labs Reviewed  URINALYSIS, ROUTINE W REFLEX MICROSCOPIC - Abnormal; Notable for the following components:      Result Value   Glucose, UA >1,000 (*)    Ketones, ur TRACE (*)    Protein, ur TRACE (*)    All other components within normal limits  LIPASE, BLOOD  CBG MONITORING, ED  TROPONIN I (HIGH SENSITIVITY)    EKG EKG Interpretation  Date/Time:  Tuesday December 07 2021 19:18:54 EDT Ventricular Rate:  91 PR Interval:  159 QRS Duration: 97 QT Interval:  376 QTC Calculation: 463 R Axis:   50 Text Interpretation: Sinus rhythm Consider anterior infarct Confirmed by 05-08-2004 (  [id number]) on 12/07/2021 7:29:34 PM  Radiology CT ABDOMEN PELVIS W CONTRAST  Result Date: 12/07/2021 CLINICAL DATA:  Abdominal pain for 2 days, initial encounter EXAM: CT ABDOMEN AND PELVIS WITH CONTRAST TECHNIQUE: Multidetector CT imaging of the abdomen and pelvis was performed using the standard protocol following bolus administration of intravenous contrast. RADIATION DOSE REDUCTION: This exam was performed according to the departmental dose-optimization program which includes automated exposure control, adjustment of the mA and/or kV according to patient size and/or use of iterative reconstruction technique. CONTRAST:  43mL OMNIPAQUE IOHEXOL 300 MG/ML  SOLN COMPARISON:  02/13/2004 FINDINGS: Lower chest: No acute abnormality. Hepatobiliary: No focal liver abnormality is seen. Status post cholecystectomy. No biliary dilatation. Pancreas: Unremarkable. No pancreatic ductal dilatation or surrounding inflammatory changes. Spleen: Normal in size without focal abnormality. Adrenals/Urinary Tract: Adrenal glands are well visualize without focal lesion. Kidneys show a normal enhancement pattern bilaterally. Benedict Kue simple appearing cysts are noted bilaterally. No follow-up is recommended. No  renal calculi or obstructive changes are seen. The bladder is partially distended. Stomach/Bowel: Scattered diverticular change of the colon is noted. No evidence of diverticulitis is seen. The appendix is within normal limits. Scottie Stanish bowel and stomach are unremarkable. Vascular/Lymphatic: Aortic atherosclerosis. No enlarged abdominal or pelvic lymph nodes. Reproductive: Uterus is bulky in appearance with a focal 6 cm fibroid eccentric to the left. No adnexal abnormality is noted. Other: No abdominal wall hernia or abnormality. No abdominopelvic ascites. Musculoskeletal: No acute or significant osseous findings. IMPRESSION: Uterine fibroids. Diverticulosis without diverticulitis. Electronically Signed   By: Inez Catalina M.D.   On: 12/07/2021 21:26   DG Abdomen 1 View  Result Date: 12/07/2021 CLINICAL DATA:  Mid to upper abdominal pain x2 days. EXAM: ABDOMEN - 1 VIEW COMPARISON:  None Available. FINDINGS: Numerous overlying radiopaque cardiac lead wires are noted. The bowel gas pattern is normal. A mild to moderate severity stool burden is noted. Radiopaque surgical clips are seen overlying the right upper quadrant. No radio-opaque calculi or other significant radiographic abnormality are seen. IMPRESSION: Mild to moderate severity stool burden without evidence of bowel obstruction. Electronically Signed   By: Virgina Norfolk M.D.   On: 12/07/2021 19:43   DG Chest 1 View  Result Date: 12/07/2021 CLINICAL DATA:  Epigastric pain.  Pain is worse after she eats. EXAM: CHEST  1 VIEW COMPARISON:  Chest x-ray 12/06/2021 FINDINGS: The heart size and mediastinal contours are within normal limits. Both lungs are clear. The visualized skeletal structures are unremarkable. IMPRESSION: No active disease. Electronically Signed   By: San Morelle M.D.   On: 12/07/2021 19:42   DG Chest Port 1 View  Result Date: 12/06/2021 CLINICAL DATA:  Cough and chest pain EXAM: PORTABLE CHEST 1 VIEW COMPARISON:  Chest  x-ray dated January 14, 2021 FINDINGS: The heart size and mediastinal contours are within normal limits. Mild linear opacities of the lower left lung, likely due to scarring or atelectasis. Both lungs are otherwise clear. The visualized skeletal structures are unremarkable. IMPRESSION: No active disease. Electronically Signed   By: Yetta Glassman M.D.   On: 12/06/2021 08:10    Procedures Procedures   Medications Ordered in ED Medications  famotidine (PEPCID) IVPB 20 mg premix (20 mg Intravenous New Bag/Given 12/07/21 2304)  alum & mag hydroxide-simeth (MAALOX/MYLANTA) 200-200-20 MG/5ML suspension 30 mL (30 mLs Oral Given 12/07/21 1914)  fentaNYL (SUBLIMAZE) injection 25 mcg (25 mcg Intravenous Given 12/07/21 2032)  iohexol (OMNIPAQUE) 300 MG/ML solution 100 mL (80 mLs Intravenous Contrast Given 12/07/21 2105)  HYDROcodone-acetaminophen (NORCO/VICODIN) 5-325 MG per tablet 1 tablet (1 tablet Oral Given 12/07/21 2302)    ED Course/ Medical Decision Making/ A&P                           Medical Decision Making Patient is a 56 year old female, here for epigastric pain for the last couple days.  Was seen at ER yesterday, she states that no CT abdomen pelvis was completed.  Notes that she is not having shortness of breath, chest pain, or just having epigastric pain, with associated nausea and vomiting.  Will trial GI cocktail obtain chest x-ray abdominal x-ray, and troponin, previously had labs done at the [facility], ER today.  They were all unremarkable including unremarkable lipase.  Amount and/or Complexity of Data Reviewed Labs: ordered.    Details: Troponin within normal limits, no acute findings on labs. Radiology: ordered.    Details: KUB, chest x-ray within normal limits.  CT abdomen pelvis shows diverticulosis without diverticulitis. ECG/medicine tests: ordered. Decision-making details documented in ED Course. Discussion of management or test interpretation with external provider(s):  Patient here for epigastric pain with associated nausea and vomiting worse with eating.  Tenderness to palpation of epigastrium, no guarding or rebound, persistently asking for CT abdomen pelvis, which was unremarkable.  She had no free air on her KUB or chest x-ray.  Her symptoms are very similar to severe GERD versus PUD.  She was allergic to lansoprazole so she is given famotidine instead.  She was advised to follow-up with her PCP, and GI for a likely scope.  She was instructed to take famotidine twice a day, and Maalox as needed.  She had no shortness of breath, or chest discomfort, she is not tachycardic, or hypoxic.  She had no guarding on exam.  She was otherwise well-appearing.  Gins concerning for GERD versus PUD secondary to epigastric nature, daily NSAID use, dull aching sensation which is sharp with eating.  She has no current gallbladder.  Risk OTC drugs. Prescription drug management.   Final Clinical Impression(s) / ED Diagnoses Final diagnoses:  Epigastric pain  Nausea and vomiting, unspecified vomiting type    Rx / DC Orders ED Discharge Orders          Ordered    famotidine (PEPCID) 20 MG tablet  2 times daily        12/07/21 2300              Durenda Pechacek, [place], [place] 12/07/21 2340    Maia Plan, MD 12/08/21 1136

## 2021-12-07 NOTE — Telephone Encounter (Signed)
Received notification from [facility] regarding a prior authorization for [place] Flextouch through [employer].   Authorization has been submitted via [employer] for [employer]. Authorization is pending.    Key: [id number]

## 2021-12-09 ENCOUNTER — Ambulatory Visit (INDEPENDENT_AMBULATORY_CARE_PROVIDER_SITE_OTHER): Payer: Commercial Managed Care - HMO | Admitting: Family

## 2021-12-09 ENCOUNTER — Encounter: Payer: Self-pay | Admitting: Family

## 2021-12-09 VITALS — BP 110/68 | HR 97 | Temp 98.2°F | Ht 61.0 in | Wt 207.8 lb

## 2021-12-09 DIAGNOSIS — R1013 Epigastric pain: Secondary | ICD-10-CM

## 2021-12-09 DIAGNOSIS — E119 Type 2 diabetes mellitus without complications: Secondary | ICD-10-CM

## 2021-12-09 DIAGNOSIS — Z794 Long term (current) use of insulin: Secondary | ICD-10-CM | POA: Diagnosis not present

## 2021-12-09 DIAGNOSIS — R109 Unspecified abdominal pain: Secondary | ICD-10-CM | POA: Diagnosis not present

## 2021-12-09 MED ORDER — SUCRALFATE 1 G PO TABS: 1.0000 g | ORAL_TABLET | Freq: Two times a day (BID) | ORAL | 0 refills | Status: DC

## 2021-12-09 NOTE — Progress Notes (Signed)
Michelle Mercer is a 56 y.o. female with the following history as recorded in EpicCare:  Patient Active Problem List   Diagnosis Date Noted   Iron deficiency anemia, unspecified 07/10/2012   Type II or unspecified type diabetes mellitus without mention of complication, not stated as uncontrolled 07/10/2012    Current Outpatient Medications  Medication Sig Dispense Refill   albuterol (VENTOLIN HFA) 108 (90 Base) MCG/ACT inhaler Inhale 2 puffs into the lungs every 6 (six) hours as needed for wheezing. 18 g 0   amLODipine (NORVASC) 5 MG tablet Take 1 tablet (5 mg total) by mouth daily. 90 tablet 1   aspirin 81 MG EC tablet Take 1 tablet (81 mg total) by mouth daily. 90 tablet 1   Cyanocobalamin (VITAMIN B 12 PO) Take by mouth every morning.     Dulaglutide (TRULICITY) 1.5 MG/0.5ML SOPN Inject 1.5 mg into the skin once a week. 6 mL 3   empagliflozin (JARDIANCE) 25 MG TABS tablet Take 1 tablet (25 mg total) by mouth daily. 90 tablet 0   famotidine (PEPCID) 20 MG tablet Take 1 tablet (20 mg total) by mouth 2 (two) times daily. 60 tablet 0   fluticasone (FLONASE) 50 MCG/ACT nasal spray Place 2 sprays into both nostrils daily. 16 g 6   insulin degludec (TRESIBA FLEXTOUCH) 200 UNIT/ML FlexTouch Pen Inject 90 Units into the skin daily in the afternoon. 45 mL 1   insulin lispro (HUMALOG KWIKPEN) 100 UNIT/ML KwikPen Max daily 30 units per scale 30 mL 1   Insulin Pen Needle (B-D ULTRAFINE III SHORT PEN) 31G X 8 MM MISC Inject 1 Device into the skin in the morning, at noon, in the evening, and at bedtime. 400 each 3   losartan-hydrochlorothiazide (HYZAAR) 100-25 MG tablet Take 1 tablet by mouth daily. 90 tablet 1   ondansetron (ZOFRAN-ODT) 4 MG disintegrating tablet 4mg  ODT q4 hours prn nausea/vomit 20 tablet 0   pioglitazone (ACTOS) 30 MG tablet Take 1 tablet (30 mg total) by mouth daily. 90 tablet 0   promethazine-dextromethorphan (PROMETHAZINE-DM) 6.25-15 MG/5ML syrup Take 5 mLs by mouth 4 (four) times  daily as needed for cough. 118 mL 0   sucralfate (CARAFATE) 1 g tablet Take 1 tablet (1 g total) by mouth 2 (two) times daily. 30 tablet 0   benzonatate (TESSALON) 100 MG capsule Take 1 capsule (100 mg total) by mouth every 8 (eight) hours. (Patient not taking: Reported on 12/09/2021) 21 capsule 0   Cholecalciferol (VITAMIN D-3) 125 MCG (5000 UT) TABS Take 1 tablet by mouth daily. (Patient not taking: Reported on 12/09/2021)     Continuous Blood Gluc Receiver (DEXCOM G6 RECEIVER) DEVI 1 Device by Does not apply route as directed. Change every 90 days (Patient not taking: Reported on 11/26/2021) 1 each 0   Continuous Blood Gluc Sensor (DEXCOM G6 SENSOR) MISC 1 Device by Does not apply route as directed. Change every 10 days (Patient not taking: Reported on 11/26/2021) 9 each 3   Continuous Blood Gluc Transmit (DEXCOM G6 TRANSMITTER) MISC 1 Device by Does not apply route as directed. (Patient not taking: Reported on 11/26/2021) 1 each 3   doxycycline (VIBRA-TABS) 100 MG tablet Take 1 tablet (100 mg total) by mouth 2 (two) times daily. (Patient not taking: Reported on 12/09/2021) 14 tablet 0   glucose blood (PRECISION QID TEST) test strip Monitor BG three to four times daily, before meals and bedtime (Patient not taking: Reported on 12/09/2021)     meloxicam (MOBIC) 15 MG tablet  TAKE ONE TABLET BY MOUTH ONE TIME DAILY (Patient not taking: Reported on 12/09/2021) 30 tablet 0   No current facility-administered medications for this visit.    Allergies: Iodine, Lisinopril, Saxagliptin-metformin er, Lansoprazole, Latex, and Sulfamethoxazole-trimethoprim  Past Medical History:  Diagnosis Date   Diabetes mellitus without complication ([place])    Hypertension    Iron deficiency anemia, unspecified 07/10/2012   Type II or unspecified type diabetes mellitus without mention of complication, not stated as uncontrolled 07/10/2012    Past Surgical History:  Procedure Laterality Date   BACK SURGERY     CESAREAN  SECTION     CHOLECYSTECTOMY     TUBAL LIGATION      Family History  Problem Relation Age of Onset   Diabetes Mother    Hypertension Father    Heart disease Father    Diabetes Father    Asthma Sister    Asthma Sister     Social History   Tobacco Use   Smoking status: Never   Smokeless tobacco: Never  Substance Use Topics   Alcohol use: Yes    Comment: occasional    Subjective:   Has been seen in ER on 2 different occasions this week with RUQ pain/ epigastric pain; recommended to follow up with GI- actually scheduled to go to [facility] tomorrow; notes having marked pain in her abdomen whenever she tries to eat; symptoms have just started in the past few days; no dark colored stools; has had gallbladder removed;  Only taking Pepcid at this time- had hives on Prevacid in the past;      Objective:  Vitals:   12/09/21 1430 12/09/21 1452  BP: (!) 150/60 110/68  Pulse: 97   Temp: 98.2 F (36.8 C)   TempSrc: Oral   SpO2: 98%   Weight: 207 lb 12.8 oz (94.3 kg)   Height: 5\' 1"  (1.549 m)     General: Well developed, well nourished, in no acute distress  Skin : Warm and dry.  Head: Normocephalic and atraumatic  Lungs: Respirations unlabored; clear to auscultation bilaterally without wheeze, rales, rhonchi  CVS exam: normal rate and regular rhythm.  Abdomen: Soft; nontender; nondistended; normoactive bowel sounds; no masses or hepatosplenomegaly  Neurologic: Alert and oriented; speech intact; face symmetrical; moves all extremities well; CNII-XII intact without focal deficit  Assessment:  1. Abdominal pain, unspecified abdominal location   2. Epigastric pain   3. Type 2 diabetes mellitus without complication, with long-term current use of insulin ([place])     Plan:  Reviewed ER notes from past 2 visits; Keep planned follow up with GI for 10/27; can try adding Carafate to her Pepcid; she does not need work note at this time; Stressed again the need to see her endocrinologist for  follow up;   No follow-ups on file.  Orders Placed This Encounter  Procedures   Ambulatory referral to Gastroenterology    Referral Priority:   Emergency    Referral Type:   Consultation    Referral Reason:   Specialty Services Required    Referred to Provider:   Arta Silence, MD    Number of Visits Requested:   1    Requested Prescriptions   Signed Prescriptions Disp Refills   sucralfate (CARAFATE) 1 g tablet 30 tablet 0    Sig: Take 1 tablet (1 g total) by mouth 2 (two) times daily.

## 2021-12-09 NOTE — Telephone Encounter (Signed)
Received a fax regarding Prior Authorization from [place] for [place] flextouch.   Authorization has been DENIED because there is no indication of intolerance or an inability to use both Basaglar and Levemir. May be subject to step therapy requirements.  Denial letter scanned to chart.  Request ID: [id number]

## 2021-12-16 ENCOUNTER — Telehealth: Payer: Self-pay | Admitting: Internal Medicine

## 2021-12-16 MED ORDER — TRESIBA FLEXTOUCH 200 UNIT/ML ~~LOC~~ SOPN: 90.0000 [IU] | PEN_INJECTOR | Freq: Every day | SUBCUTANEOUS | 0 refills | Status: DC

## 2021-12-16 NOTE — Addendum Note (Signed)
Addended by: Lauralyn Primes on: 12/16/2021 04:38 PM   Modules accepted: Orders

## 2021-12-16 NOTE — Telephone Encounter (Signed)
On call provider advised 1 box of rx be sent to pharmacy.

## 2021-12-16 NOTE — Telephone Encounter (Signed)
MEDICATION: insulin degludec (TRESIBA FLEXTOUCH) 200 UNIT/ML FlexTouch Pen   PHARMACY:    [facility] [address] - [place], [place] - [address] [place] [address] (Ph: [phone])    HAS THE PATIENT CONTACTED THEIR PHARMACY?  Yes  IS THIS A 90 DAY SUPPLY : Yes  IS PATIENT OUT OF MEDICATION: Yes  IF NOT; HOW MUCH IS LEFT:   LAST APPOINTMENT DATE: @10 /05/2020 (at [facility])  NEXT APPOINTMENT DATE:@3 /06/2022  DO WE HAVE YOUR PERMISSION TO LEAVE A DETAILED MESSAGE?:  OTHER COMMENTS:    **Let patient know to contact pharmacy at the end of the day to make sure medication is ready. **  ** Please notify patient to allow 48-72 hours to process**  **Encourage patient to contact the pharmacy for refills or they can request refills through [facility]**

## 2021-12-20 ENCOUNTER — Telehealth: Payer: Self-pay | Admitting: Neurology

## 2021-12-20 NOTE — Telephone Encounter (Signed)
[employer] pending faxed notes  

## 2021-12-27 ENCOUNTER — Telehealth: Payer: Self-pay

## 2021-12-27 ENCOUNTER — Other Ambulatory Visit: Payer: Self-pay | Admitting: Internal Medicine

## 2021-12-27 MED ORDER — BASAGLAR KWIKPEN 100 UNIT/ML ~~LOC~~ SOPN: 90.0000 [IU] | PEN_INJECTOR | Freq: Every day | SUBCUTANEOUS | 4 refills | Status: DC

## 2021-12-27 NOTE — Telephone Encounter (Signed)
Patient states that insurance will cover the Basaglar but not the [place].

## 2021-12-27 NOTE — Telephone Encounter (Signed)
done

## 2021-12-29 ENCOUNTER — Telehealth: Payer: Self-pay

## 2021-12-29 ENCOUNTER — Other Ambulatory Visit: Payer: Self-pay | Admitting: Family

## 2021-12-29 ENCOUNTER — Other Ambulatory Visit (HOSPITAL_COMMUNITY): Payer: Self-pay | Admitting: Gastroenterology

## 2021-12-29 DIAGNOSIS — R112 Nausea with vomiting, unspecified: Secondary | ICD-10-CM

## 2021-12-29 NOTE — Telephone Encounter (Signed)
Plan does not cover Jardiance, Pt must first try and fail [place]. Please advise.

## 2022-01-03 ENCOUNTER — Other Ambulatory Visit: Payer: Self-pay | Admitting: Family

## 2022-01-03 MED ORDER — DAPAGLIFLOZIN PROPANEDIOL 10 MG PO TABS: 10.0000 mg | ORAL_TABLET | Freq: Every day | ORAL | 3 refills | Status: DC

## 2022-01-03 NOTE — Telephone Encounter (Signed)
I have called the pt and she stated that she is willing to switch to the [place] since [place] is not being covered for her plan. She sees Endo in March.  Please advise on the med change.

## 2022-01-03 NOTE — Telephone Encounter (Signed)
Farxiga 10mg  sent to pharmacy.

## 2022-01-13 ENCOUNTER — Encounter (HOSPITAL_COMMUNITY)
Admission: RE | Admit: 2022-01-13 | Discharge: 2022-01-13 | Disposition: A | Discharge status: Home / Self Care | Payer: Commercial Managed Care - HMO | Source: Ambulatory Visit | Attending: Gastroenterology | Admitting: Gastroenterology

## 2022-01-13 DIAGNOSIS — R112 Nausea with vomiting, unspecified: Secondary | ICD-10-CM | POA: Insufficient documentation

## 2022-02-08 ENCOUNTER — Encounter (HOSPITAL_COMMUNITY)
Admission: RE | Admit: 2022-02-08 | Discharge: 2022-02-08 | Disposition: A | Discharge status: Home / Self Care | Payer: Commercial Managed Care - HMO | Source: Ambulatory Visit | Attending: Gastroenterology | Admitting: Gastroenterology

## 2022-02-08 DIAGNOSIS — R112 Nausea with vomiting, unspecified: Secondary | ICD-10-CM | POA: Diagnosis present

## 2022-02-08 MED ORDER — TECHNETIUM TC 99M SULFUR COLLOID
2.0000 | Freq: Once | INTRAVENOUS | Status: AC | PRN
  Administered 2022-02-08: 2 via INTRAVENOUS

## 2022-02-28 ENCOUNTER — Encounter: Payer: Self-pay | Admitting: Family

## 2022-03-01 ENCOUNTER — Other Ambulatory Visit: Payer: Self-pay | Admitting: Family

## 2022-03-01 MED ORDER — FLUCONAZOLE 150 MG PO TABS: ORAL_TABLET | ORAL | 0 refills | Status: DC

## 2022-03-12 ENCOUNTER — Encounter: Payer: Self-pay | Admitting: Family

## 2022-03-12 ENCOUNTER — Encounter: Payer: Self-pay | Admitting: Internal Medicine

## 2022-03-14 ENCOUNTER — Other Ambulatory Visit: Payer: Self-pay | Admitting: Family

## 2022-03-21 ENCOUNTER — Other Ambulatory Visit: Payer: Self-pay | Admitting: Internal Medicine

## 2022-03-21 MED ORDER — TRULICITY 3 MG/0.5ML ~~LOC~~ SOAJ: 3.0000 mg | SUBCUTANEOUS | 2 refills | Status: DC

## 2022-03-23 ENCOUNTER — Telehealth: Payer: Self-pay

## 2022-03-23 ENCOUNTER — Other Ambulatory Visit (HOSPITAL_COMMUNITY): Payer: Self-pay

## 2022-03-23 NOTE — Telephone Encounter (Signed)
PA has been submitted and has been APPROVED.

## 2022-03-23 NOTE — Telephone Encounter (Signed)
PA request received via providers office for Trulicity 3MG /0.5ML pen-injectors  PA has been submitted via [employer] to [facility] and has been APPROVED from 03/23/2022-03/22/2023  Key: [id number]

## 2022-03-23 NOTE — Telephone Encounter (Signed)
Trulicity needs a PA

## 2022-03-26 ENCOUNTER — Other Ambulatory Visit: Payer: Self-pay | Admitting: Family

## 2022-04-19 ENCOUNTER — Encounter: Payer: Self-pay | Admitting: Internal Medicine

## 2022-04-19 ENCOUNTER — Ambulatory Visit (INDEPENDENT_AMBULATORY_CARE_PROVIDER_SITE_OTHER): Payer: Self-pay | Admitting: Internal Medicine

## 2022-04-19 VITALS — BP 122/80 | HR 87 | Ht 61.0 in | Wt 207.0 lb

## 2022-04-19 DIAGNOSIS — Z794 Long term (current) use of insulin: Secondary | ICD-10-CM

## 2022-04-19 DIAGNOSIS — E1165 Type 2 diabetes mellitus with hyperglycemia: Secondary | ICD-10-CM | POA: Insufficient documentation

## 2022-04-19 LAB — POCT GLYCOSYLATED HEMOGLOBIN (HGB A1C): Hemoglobin A1C: 10.6 % — AB (ref 4.0–5.6)

## 2022-04-19 LAB — POCT GLUCOSE (DEVICE FOR HOME USE): POC Glucose: 84 mg/dl (ref 70–99)

## 2022-04-19 MED ORDER — TRULICITY 3 MG/0.5ML ~~LOC~~ SOAJ: 3.0000 mg | SUBCUTANEOUS | 2 refills | Status: DC

## 2022-04-19 MED ORDER — BASAGLAR KWIKPEN 100 UNIT/ML ~~LOC~~ SOPN: 70.0000 [IU] | PEN_INJECTOR | Freq: Every day | SUBCUTANEOUS | 2 refills | Status: DC

## 2022-04-19 MED ORDER — DAPAGLIFLOZIN PROPANEDIOL 10 MG PO TABS: 10.0000 mg | ORAL_TABLET | Freq: Every day | ORAL | 2 refills | Status: DC

## 2022-04-19 MED ORDER — DEXCOM G7 SENSOR MISC: 1.0000 | 3 refills | Status: DC

## 2022-04-19 MED ORDER — PIOGLITAZONE HCL 30 MG PO TABS: 30.0000 mg | ORAL_TABLET | Freq: Every day | ORAL | 2 refills | Status: DC

## 2022-04-19 MED ORDER — INSULIN LISPRO (1 UNIT DIAL) 100 UNIT/ML (KWIKPEN): PEN_INJECTOR | SUBCUTANEOUS | 2 refills | Status: DC

## 2022-04-19 MED ORDER — BD PEN NEEDLE SHORT U/F 31G X 8 MM MISC: 1.0000 | Freq: Four times a day (QID) | 2 refills | Status: DC

## 2022-04-19 NOTE — Patient Instructions (Signed)
-   Continue Farxiga 10 mg, 1 tablet every morning  - Continue Trulicity 3 mg weekly  - Decrease Basaglar  70 units once daily  - Continue  Actos (Pioglitazone ) 30 mg daily  - Humalog correctional insulin: ADD extra units on insulin to your meal-time Humalog dose if your blood sugars are higher than 150. Use the scale below to help guide you:   Blood sugar before meal Number of units to inject  Less than 150 0 unit  151 -  170 1 units  171 -  190 2 units  191 -  210 3 units  211 -  230 4 units  231 -  250 5 units  251 -  270 6 units  271 -  290 7 units  291 -  310 8 units  311 - 330 9 units    HOW TO TREAT LOW BLOOD SUGARS (Blood sugar LESS THAN 70 MG/DL) Please follow the RULE OF 15 for the treatment of hypoglycemia treatment (when your (blood sugars are less than 70 mg/dL)   STEP 1: Take 15 grams of carbohydrates when your blood sugar is low, which includes:  3-4 GLUCOSE TABS  OR 3-4 OZ OF JUICE OR REGULAR SODA OR ONE TUBE OF GLUCOSE GEL    STEP 2: RECHECK blood sugar in 15 MINUTES STEP 3: If your blood sugar is still low at the 15 minute recheck --> then, go back to STEP 1 and treat AGAIN with another 15 grams of carbohydrates.

## 2022-04-19 NOTE — Progress Notes (Signed)
Name: Michelle Mercer  Age/ Sex: 57 y.o., female   MRN/ DOB: [id number], Sep 20, 1965     PCP: Marrian Salvage, FNP   Reason for Endocrinology Evaluation: Type 2 Diabetes Mellitus  Initial Endocrine Consultative Visit: 08/11/2020    PATIENT IDENTIFIER: Michelle Mercer is a 58 y.o. female with a past medical history of T2DM, HTN and dyslipidemia . The patient has followed with Endocrinology clinic since 08/11/2020 for consultative assistance with management of her diabetes.  DIABETIC HISTORY:  Michelle Mercer was diagnosed with DM in her 5's, Metformin - diarrhea . Her hemoglobin A1c has ranged from 7.1% in 2021, peaking at 15.0% in 2020.  On her initial visit to our clinic she had an A1c of 7.7%  , she was on Jardiance, Ozempic, Pioglitazone and MDI regimen   SUBJECTIVE:   During the last visit (11/17/2020): A1c 7.1 %     Today (04/19/2022): Michelle Mercer is here for a follow up on diabetes management. She has NOT been to our clinic in 17 months.  She has been checking glucose once daily.   She has been using her dad's meter as her monitor ran out of battery  She has chronic loose stool  Denies nausea or vomiting   DIABETES REGIMEN:  Farxiga 10 mg, 1 tablet every morning  Trulicity 3 mg weekly  Basaglar  72 units once daily  Pioglitazone 30 mg daily  CF: Humalog (BG -130/20)   Statin: no ACE-I/ARB: yes    METER DOWNLOAD SUMMARY: Did not bring     DIABETIC COMPLICATIONS: Microvascular complications:   Denies: CKD, retinopathy neuropathy Last Eye Exam: Completed > 1 yr ago   Macrovascular complications:   Denies: CAD, CVA, PVD   HISTORY:  Past Medical History:  Past Medical History:  Diagnosis Date   Diabetes mellitus without complication ([facility])    Hypertension    Iron deficiency anemia, unspecified 07/10/2012   Type II or unspecified type diabetes mellitus without mention of complication, not stated as uncontrolled 07/10/2012   Past Surgical History:  Past  Surgical History:  Procedure Laterality Date   BACK SURGERY     CESAREAN SECTION     CHOLECYSTECTOMY     TUBAL LIGATION     Social History:  reports that she has never smoked. She has never used smokeless tobacco. She reports current alcohol use. She reports that she does not use drugs. Family History:  Family History  Problem Relation Age of Onset   Diabetes Mother    Hypertension Father    Heart disease Father    Diabetes Father    Asthma Sister    Asthma Sister      HOME MEDICATIONS: Allergies as of 04/19/2022       Reactions   Iodine Itching   Patient not allergic to CT contrast. Had CT IV contrast on 12/07/21 (authorized by Dr. Laverta Baltimore) Patient states she is allergic to iodine but not allergic to CT IV contrast. Patient had no reaction to the CT IV contrast on 12/07/21.    Lisinopril Nausea And Vomiting, Cough   Saxagliptin-metformin Er Diarrhea   Lansoprazole Hives, Nausea Only   Latex Rash   Sulfamethoxazole-trimethoprim Rash        Medication List        Accurate as of April 19, 2022  1:37 PM. If you have any questions, ask your nurse or doctor.          STOP taking these medications    benzonatate 100 MG capsule Commonly  known as: TESSALON Stopped by: Dorita Sciara, MD   doxycycline 100 MG tablet Commonly known as: VIBRA-TABS Stopped by: Dorita Sciara, MD   fluconazole 150 MG tablet Commonly known as: DIFLUCAN Stopped by: Dorita Sciara, MD   fluticasone 50 MCG/ACT nasal spray Commonly known as: FLONASE Stopped by: Dorita Sciara, MD   meloxicam 15 MG tablet Commonly known as: MOBIC Stopped by: Dorita Sciara, MD   ondansetron 4 MG disintegrating tablet Commonly known as: ZOFRAN-ODT Stopped by: Dorita Sciara, MD   Precision QID Test test strip Generic drug: glucose blood Stopped by: Dorita Sciara, MD   promethazine-dextromethorphan 6.25-15 MG/5ML syrup Commonly known as:  PROMETHAZINE-DM Stopped by: Dorita Sciara, MD   sucralfate 1 g tablet Commonly known as: Carafate Stopped by: Dorita Sciara, MD       TAKE these medications    albuterol 108 (90 Base) MCG/ACT inhaler Commonly known as: VENTOLIN HFA Inhale 2 puffs into the lungs every 6 (six) hours as needed for wheezing.   amLODipine 5 MG tablet Commonly known as: NORVASC Take 1 tablet (5 mg total) by mouth daily.   aspirin EC 81 MG tablet Take 1 tablet (81 mg total) by mouth daily.   B-D ULTRAFINE III SHORT PEN 31G X 8 MM Misc Generic drug: Insulin Pen Needle Inject 1 Device into the skin in the morning, at noon, in the evening, and at bedtime.   Basaglar KwikPen 100 UNIT/ML Inject 90 Units into the skin daily. What changed: how much to take   dapagliflozin propanediol 10 MG Tabs tablet Commonly known as: Farxiga Take 1 tablet (10 mg total) by mouth daily before breakfast.   Dexcom G6 Receiver Devi 1 Device by Does not apply route as directed. Change every 90 days   Dexcom G6 Transmitter Misc 1 Device by Does not apply route as directed.   Dexcom G7 Sensor Misc 1 Device by Does not apply route as directed. What changed: additional instructions Changed by: Dorita Sciara, MD   famotidine 20 MG tablet Commonly known as: PEPCID Take 1 tablet (20 mg total) by mouth 2 (two) times daily.   insulin lispro 100 UNIT/ML KwikPen Commonly known as: HumaLOG KwikPen Max daily 30 units per scale   losartan-hydrochlorothiazide 100-25 MG tablet Commonly known as: HYZAAR Take 1 tablet by mouth daily.   pioglitazone 30 MG tablet Commonly known as: ACTOS TAKE ONE TABLET BY MOUTH ONE TIME DAILY   Trulicity 3 0000000 Sopn Generic drug: Dulaglutide Inject 3 mg as directed once a week.   VITAMIN B 12 PO Take by mouth every morning.   Vitamin D-3 125 MCG (5000 UT) Tabs Take 1 tablet by mouth daily.         OBJECTIVE:   Vital Signs: BP 122/80 (BP Location:  Left Arm, Patient Position: Sitting, Cuff Size: Large)   Pulse 87   Ht '5\' 1"'$  (1.549 m)   Wt 207 lb (93.9 kg)   SpO2 97%   BMI 39.11 kg/m   Wt Readings from Last 3 Encounters:  04/19/22 207 lb (93.9 kg)  12/09/21 207 lb 12.8 oz (94.3 kg)  12/07/21 210 lb (95.3 kg)     Exam: General: Pt appears well and is in NAD  Lungs: Clear with good BS bilat   Heart: RRR  Abdomen: Normoactive bowel sounds, soft, nontender, without masses or organomegaly palpable  Extremities: No pretibial edema.   Neuro: MS is good with appropriate affect, pt is alert and  Ox3    DM foot exam: 04/19/2022  The skin of the feet is intact without sores or ulcerations. The pedal pulses are 2+ on right and 2+ on left. The sensation is intact to a screening 5.07, 10 gram monofilament bilaterally        DATA REVIEWED:  Lab Results  Component Value Date   HGBA1C 10.6 (A) 04/19/2022   HGBA1C 9.6 (H) 07/30/2021   HGBA1C 7.1 (A) 11/17/2020    Latest Reference Range & Units 12/07/21 12:12  Sodium 135 - 145 mmol/L 140  Potassium 3.5 - 5.1 mmol/L 3.9  Chloride 98 - 111 mmol/L 105  CO2 22 - 32 mmol/L 27  Glucose 70 - 99 mg/dL 108 (H)  BUN 6 - 20 mg/dL 16  Creatinine 0.44 - 1.00 mg/dL 1.07 (H)  Calcium 8.9 - 10.3 mg/dL 8.9  Anion gap 5 - 15  8  Alkaline Phosphatase 38 - 126 U/L 64  Albumin 3.5 - 5.0 g/dL 3.6  Lipase 11 - 51 U/L 42  AST 15 - 41 U/L 14 (L)  ALT 0 - 44 U/L 12  Total Protein 6.5 - 8.1 g/dL 7.8  Total Bilirubin 0.3 - 1.2 mg/dL 0.5  GFR, Estimated >60 mL/min >60      ASSESSMENT / PLAN / RECOMMENDATIONS:   1) Type 2 Diabetes Mellitus, Poorly controlled, With microalbuminuria  - Most recent A1c of 10.6 %. Goal A1c < 7.0 %.    - Poorly controlled diabetes , the pt has not been to our clinic in > 1.5 yrs, she was without her glycemic agents for extended period - Will restart medication as below -A new prescription for Dexcom G7 has been sent to the pharmacy as well as a new referral for  Dexcom training -She was provided with Dexcom G7 sensor sample    MEDICATIONS: Farxiga 10 mg, 1 tablet every morning  Trulicity 3 mg weekly  Decrease Basaglar 70 units once daily   Actos (Pioglitazone ) 30 mg daily  Correction Scale: Humalog (BG -130/20) 3 times daily before every meal  EDUCATION / INSTRUCTIONS: BG monitoring instructions: Patient is instructed to check her blood sugars 3 times a day, before each meal . Call [facility] if: BG persistently < 70  I reviewed the Rule of 15 for the treatment of hypoglycemia in detail with the patient. Literature supplied.    2) Diabetic complications:  Eye: Does not have known diabetic retinopathy.  Neuro/ Feet: Does not have known diabetic peripheral neuropathy .  Renal: Patient does not have known baseline CKD. She   is  on an ACEI/ARB at present.     F/U in 3 months     Signed electronically by: Mack Guise, MD  [facility]  [facility] [address] [place], [place] [address] Phone: [phone] FAX: [phone]   CC: Marrian Salvage, [place] [address]  [address] Phone: [phone]  Fax: [phone]  Return to Endocrinology clinic as below: No future appointments.

## 2022-05-04 ENCOUNTER — Other Ambulatory Visit: Payer: Self-pay

## 2022-05-04 ENCOUNTER — Telehealth: Payer: Self-pay

## 2022-05-04 DIAGNOSIS — I10 Essential (primary) hypertension: Secondary | ICD-10-CM

## 2022-05-04 MED ORDER — ASPIRIN 81 MG PO TBEC: 81.0000 mg | DELAYED_RELEASE_TABLET | Freq: Every day | ORAL | 1 refills | Status: DC

## 2022-05-04 MED ORDER — AMLODIPINE BESYLATE 5 MG PO TABS: 5.0000 mg | ORAL_TABLET | Freq: Every day | ORAL | 1 refills | Status: DC

## 2022-05-04 NOTE — Telephone Encounter (Signed)
Pt called and would like to know when to come in for a follow up

## 2022-05-04 NOTE — Telephone Encounter (Signed)
Pt is aware and expressed understanding 

## 2022-05-24 ENCOUNTER — Encounter: Payer: Self-pay | Admitting: Internal Medicine

## 2022-05-26 ENCOUNTER — Other Ambulatory Visit (HOSPITAL_BASED_OUTPATIENT_CLINIC_OR_DEPARTMENT_OTHER): Payer: Self-pay

## 2022-05-26 ENCOUNTER — Telehealth: Payer: Self-pay

## 2022-05-26 MED ORDER — TRULICITY 3 MG/0.5ML ~~LOC~~ SOAJ
3.0000 mg | SUBCUTANEOUS | 2 refills | Status: DC
  Filled 2022-05-26: qty 6, 84d supply, fill #0
  Filled 2022-06-15 (×2): qty 2, 28d supply, fill #0

## 2022-05-26 NOTE — Telephone Encounter (Signed)
Script sent  

## 2022-05-27 ENCOUNTER — Other Ambulatory Visit (HOSPITAL_BASED_OUTPATIENT_CLINIC_OR_DEPARTMENT_OTHER): Payer: Self-pay

## 2022-06-01 ENCOUNTER — Other Ambulatory Visit (HOSPITAL_COMMUNITY): Payer: Self-pay

## 2022-06-01 ENCOUNTER — Telehealth: Payer: 59

## 2022-06-01 NOTE — Telephone Encounter (Signed)
Patient states that we need to contact her insurance to get authorization for her to get Trulicity from [employer].

## 2022-06-01 NOTE — Telephone Encounter (Signed)
  E[employer] [facility] [employer]  is what showing through e verified benefits

## 2022-06-03 ENCOUNTER — Other Ambulatory Visit (HOSPITAL_COMMUNITY): Payer: Self-pay

## 2022-06-03 ENCOUNTER — Encounter: Payer: Self-pay | Admitting: Pharmacy Technician

## 2022-06-03 NOTE — Telephone Encounter (Signed)
Can you have her send pictures of the front and back of her Pharmacy insurance card? Then we can put the information in manually. I will go ahead and send her a msg, but I wasn't sure if there's something special to do in order for her to upload images. My kid's Dr. Isidore Moos has to send me a link in order for me to upload an image.

## 2022-06-07 ENCOUNTER — Encounter: Payer: Self-pay | Admitting: Family

## 2022-06-09 ENCOUNTER — Other Ambulatory Visit (HOSPITAL_COMMUNITY): Payer: Self-pay

## 2022-06-09 ENCOUNTER — Telehealth: Payer: Self-pay | Admitting: Pharmacy Technician

## 2022-06-09 NOTE — Telephone Encounter (Signed)
PA request submitted. New Encounter created for follow up. For additional info see Pharmacy Prior Auth telephone encounter from 06/09/22.

## 2022-06-09 NOTE — Telephone Encounter (Signed)
Pharmacy Patient Advocate Encounter   Received notification from Pt calls msgs/CMA that prior authorization for Trulicity is required/requested.   PA submitted on 06/09/22 to (ins) [employer] via [employer] or (Medicaid) confirmation # [id number]  PA Case ID: [id number] Status is pending

## 2022-06-10 NOTE — Telephone Encounter (Signed)
Pharmacy Patient Advocate Encounter  Prior Authorization APPROVED  Effective dates: 06/09/22 through 06/09/23

## 2022-06-15 ENCOUNTER — Other Ambulatory Visit (HOSPITAL_COMMUNITY): Payer: Self-pay

## 2022-06-15 ENCOUNTER — Other Ambulatory Visit (HOSPITAL_BASED_OUTPATIENT_CLINIC_OR_DEPARTMENT_OTHER): Payer: Self-pay

## 2022-06-16 ENCOUNTER — Other Ambulatory Visit (HOSPITAL_COMMUNITY): Payer: Self-pay

## 2022-06-22 ENCOUNTER — Other Ambulatory Visit: Payer: Self-pay

## 2022-06-22 ENCOUNTER — Ambulatory Visit
Admission: EM | Admit: 2022-06-22 | Discharge: 2022-06-22 | Disposition: A | Discharge status: Home / Self Care | Payer: 59 | Attending: Nurse Practitioner | Admitting: Nurse Practitioner

## 2022-06-22 DIAGNOSIS — J208 Acute bronchitis due to other specified organisms: Secondary | ICD-10-CM | POA: Diagnosis not present

## 2022-06-22 DIAGNOSIS — B9689 Other specified bacterial agents as the cause of diseases classified elsewhere: Secondary | ICD-10-CM | POA: Diagnosis not present

## 2022-06-22 MED ORDER — AZITHROMYCIN 250 MG PO TABS: 250.0000 mg | ORAL_TABLET | Freq: Every day | ORAL | 0 refills | Status: DC

## 2022-06-22 MED ORDER — PROMETHAZINE-DM 6.25-15 MG/5ML PO SYRP: 5.0000 mL | ORAL_SOLUTION | Freq: Four times a day (QID) | ORAL | 0 refills | Status: DC | PRN

## 2022-06-22 NOTE — Discharge Instructions (Signed)
Start Zithromax as prescribed Promethazine DM as needed for cough.  Please note this medication can make you drowsy.  Do not drink alcohol or drive while on this medication Rest and fluids Follow-up with your PCP if your symptoms do not improve Please go to the emergency room if you develop any worsening symptoms I hope you feel better soon!

## 2022-06-22 NOTE — ED Provider Notes (Signed)
[facility]    CSN: [id number] Arrival date & time: 06/22/22  1758      History   Chief Complaint Chief Complaint  Patient presents with   Sore Throat    HPI Michelle Mercer is a 57 y.o. female  presents for evaluation of URI symptoms for 3 weeks. Patient reports associated symptoms of productive cough with sore throat. Denies N/V/D, fevers or ear pain, body aches, shortness of breath. Patient does not have a hx of asthma or smoking.  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  Pt has taken cough medicine and TheraFlu OTC for symptoms. Pt has no other concerns at this time.    Sore Throat    Past Medical History:  Diagnosis Date   Diabetes mellitus without complication ([facility])    Hypertension    Iron deficiency anemia, unspecified 07/10/2012   Type II or unspecified type diabetes mellitus without mention of complication, not stated as uncontrolled 07/10/2012    Patient Active Problem List   Diagnosis Date Noted   Type 2 diabetes mellitus with hyperglycemia, with long-term current use of insulin ([facility]) 04/19/2022   Iron deficiency anemia, unspecified 07/10/2012   Type II or unspecified type diabetes mellitus without mention of complication, not stated as uncontrolled 07/10/2012    Past Surgical History:  Procedure Laterality Date   BACK SURGERY     CESAREAN SECTION     CHOLECYSTECTOMY     TUBAL LIGATION      OB History   No obstetric history on file.      Home Medications    Prior to Admission medications   Medication Sig Start Date End Date Taking? Authorizing Provider  azithromycin (ZITHROMAX) 250 MG tablet Take 1 tablet (250 mg total) by mouth daily. Take first 2 tablets together, then 1 every day until finished. 06/22/22  Yes Radford Pax, NP  promethazine-dextromethorphan (PROMETHAZINE-DM) 6.25-15 MG/5ML syrup Take 5 mLs by mouth 4 (four) times daily as needed for cough. 06/22/22  Yes Radford Pax, NP  albuterol (VENTOLIN HFA) 108 (90 Base) MCG/ACT inhaler Inhale  2 puffs into the lungs every 6 (six) hours as needed for wheezing. 01/14/21   Saguier, Ramon Dredge, PA-C  amLODipine (NORVASC) 5 MG tablet Take 1 tablet (5 mg total) by mouth daily. 05/04/22   Olive Bass, FNP  aspirin EC 81 MG tablet Take 1 tablet (81 mg total) by mouth daily. 05/04/22   Olive Bass, FNP  Cholecalciferol (VITAMIN D-3) 125 MCG (5000 UT) TABS Take 1 tablet by mouth daily.    [provider]  Continuous Blood Gluc Sensor (DEXCOM G7 SENSOR) MISC 1 Device by Does not apply route as directed. 04/19/22   Shamleffer, Konrad Dolores, MD  Cyanocobalamin (VITAMIN B 12 PO) Take by mouth every morning.    [provider]  dapagliflozin propanediol (FARXIGA) 10 MG TABS tablet Take 1 tablet (10 mg total) by mouth daily before breakfast. 04/19/22   Shamleffer, Konrad Dolores, MD  Dulaglutide (TRULICITY) 3 MG/0.5ML SOPN Inject 3 mg as directed once a week. 05/26/22   Shamleffer, Konrad Dolores, MD  famotidine (PEPCID) 20 MG tablet Take 1 tablet (20 mg total) by mouth 2 (two) times daily. 12/07/21   Small, Brooke L, PA  Insulin Glargine (BASAGLAR KWIKPEN) 100 UNIT/ML Inject 70 Units into the skin daily. 04/19/22   Shamleffer, Konrad Dolores, MD  insulin lispro (HUMALOG KWIKPEN) 100 UNIT/ML KwikPen Max daily 30 units per scale 04/19/22   Shamleffer, Konrad Dolores, MD  Insulin Pen Needle (B-D  ULTRAFINE III SHORT PEN) 31G X 8 MM MISC Inject 1 Device into the skin in the morning, at noon, in the evening, and at bedtime. 04/19/22   Shamleffer, Konrad Dolores, MD  losartan-hydrochlorothiazide (HYZAAR) 100-25 MG tablet Take 1 tablet by mouth daily. 03/28/22   Olive Bass, FNP  pioglitazone (ACTOS) 30 MG tablet Take 1 tablet (30 mg total) by mouth daily. 04/19/22   Shamleffer, Konrad Dolores, MD    Family History Family History  Problem Relation Age of Onset   Diabetes Mother    Hypertension Father    Heart disease Father    Diabetes Father    Asthma Sister     Asthma Sister     Social History Social History   Tobacco Use   Smoking status: Never   Smokeless tobacco: Never  Vaping Use   Vaping Use: Never used  Substance Use Topics   Alcohol use: Yes    Comment: occasional   Drug use: No     Allergies   Iodine, Lisinopril, Saxagliptin-metformin er, Lansoprazole, Latex, and Sulfamethoxazole-trimethoprim   Review of Systems Review of Systems  HENT:  Positive for sore throat.   Respiratory:  Positive for cough.      Physical Exam Triage Vital Signs ED Triage Vitals  Enc Vitals Group     BP 06/22/22 1810 133/73     Pulse Rate 06/22/22 1810 97     Resp 06/22/22 1810 16     Temp 06/22/22 1810 98.9 F (37.2 C)     Temp Source 06/22/22 1810 Oral     SpO2 06/22/22 1810 95 %     Weight --      Height --      Head Circumference --      Peak Flow --      Pain Score 06/22/22 1813 3     Pain Loc --      Pain Edu? --      Excl. in GC? --    No data found.  Updated Vital Signs BP 133/73 (BP Location: Right Arm)   Pulse 97   Temp 98.9 F (37.2 C) (Oral)   Resp 16   SpO2 95%   Visual Acuity Right Eye Distance:   Left Eye Distance:   Bilateral Distance:    Right Eye Near:   Left Eye Near:    Bilateral Near:     Physical Exam Vitals and nursing note reviewed.  Constitutional:      General: She is not in acute distress.    Appearance: She is well-developed. She is not ill-appearing.  HENT:     Head: Normocephalic and atraumatic.     Right Ear: Tympanic membrane and ear canal normal.     Left Ear: Tympanic membrane and ear canal normal.     Nose: Congestion present.     Mouth/Throat:     Mouth: Mucous membranes are moist.     Pharynx: Oropharynx is clear. Uvula midline. Posterior oropharyngeal erythema present.     Tonsils: No tonsillar exudate or tonsillar abscesses.  Eyes:     Conjunctiva/sclera: Conjunctivae normal.     Pupils: Pupils are equal, round, and reactive to light.  Cardiovascular:     Rate and  Rhythm: Normal rate and regular rhythm.     Heart sounds: Normal heart sounds.  Pulmonary:     Effort: Pulmonary effort is normal.     Breath sounds: Normal breath sounds.  Musculoskeletal:     Cervical back: Normal range of  motion and neck supple.  Lymphadenopathy:     Cervical: No cervical adenopathy.  Skin:    General: Skin is warm and dry.  Neurological:     General: No focal deficit present.     Mental Status: She is alert and oriented to person, place, and time.  Psychiatric:        Mood and Affect: Mood normal.        Behavior: Behavior normal.      UC Treatments / Results  Labs (all labs ordered are listed, but only abnormal results are displayed) Labs Reviewed - No data to display  EKG   Radiology No results found.  Procedures Procedures (including critical care time)  Medications Ordered in UC Medications - No data to display  Initial Impression / Assessment and Plan / UC Course  I have reviewed the triage vital signs and the nursing notes.  Pertinent labs & imaging results that were available during my care of the patient were reviewed by me and considered in my medical decision making (see chart for details).     Reviewed exam and symptoms with patient.  No red flags Start Zithromax given length of symptoms Promethazine DM as needed for cough.  Side effect profile reviewed Rest and fluids PCP follow-up if symptoms do not improve ER precautions reviewed and patient verbalized understanding Final Clinical Impressions(s) / UC Diagnoses   Final diagnoses:  Acute bacterial bronchitis     Discharge Instructions      Start Zithromax as prescribed Promethazine DM as needed for cough.  Please note this medication can make you drowsy.  Do not drink alcohol or drive while on this medication Rest and fluids Follow-up with your PCP if your symptoms do not improve Please go to the emergency room if you develop any worsening symptoms I hope you feel  better soon!   ED Prescriptions     Medication Sig Dispense Auth. Provider   azithromycin (ZITHROMAX) 250 MG tablet Take 1 tablet (250 mg total) by mouth daily. Take first 2 tablets together, then 1 every day until finished. 6 tablet Radford Pax, NP   promethazine-dextromethorphan (PROMETHAZINE-DM) 6.25-15 MG/5ML syrup Take 5 mLs by mouth 4 (four) times daily as needed for cough. 118 mL Radford Pax, NP      PDMP not reviewed this encounter.   Radford Pax, NP 06/22/22 [phone]

## 2022-06-22 NOTE — ED Triage Notes (Signed)
Pt presents to UC w/ c/o cough x3 weeks and a sore throat since yesterday. Took theraflu today for symptoms.

## 2022-07-20 ENCOUNTER — Ambulatory Visit: Payer: Self-pay | Admitting: Internal Medicine

## 2022-07-21 ENCOUNTER — Ambulatory Visit: Payer: 59 | Admitting: Family

## 2022-07-24 ENCOUNTER — Other Ambulatory Visit: Payer: Self-pay | Admitting: Family

## 2022-07-25 ENCOUNTER — Telehealth: Payer: Self-pay

## 2022-07-25 NOTE — Telephone Encounter (Signed)
Contact patient about her appointment reschedule. It needs to be changed.

## 2022-08-01 ENCOUNTER — Ambulatory Visit: Payer: Self-pay | Admitting: Internal Medicine

## 2022-08-06 ENCOUNTER — Other Ambulatory Visit: Payer: Self-pay | Admitting: Family

## 2022-08-06 DIAGNOSIS — I10 Essential (primary) hypertension: Secondary | ICD-10-CM

## 2022-08-23 ENCOUNTER — Encounter: Payer: Self-pay | Admitting: Internal Medicine

## 2022-08-24 ENCOUNTER — Other Ambulatory Visit: Payer: Self-pay

## 2022-08-24 MED ORDER — DEXCOM G6 TRANSMITTER MISC: 3 refills | Status: DC

## 2022-09-16 ENCOUNTER — Encounter: Payer: Self-pay | Admitting: Family

## 2022-09-16 ENCOUNTER — Ambulatory Visit (INDEPENDENT_AMBULATORY_CARE_PROVIDER_SITE_OTHER): Payer: 59 | Admitting: Family

## 2022-09-16 VITALS — BP 157/68 | HR 70 | Ht 61.0 in | Wt 210.2 lb

## 2022-09-16 DIAGNOSIS — M79605 Pain in left leg: Secondary | ICD-10-CM

## 2022-09-16 DIAGNOSIS — I10 Essential (primary) hypertension: Secondary | ICD-10-CM | POA: Diagnosis not present

## 2022-09-16 LAB — CBC WITH DIFFERENTIAL/PLATELET
Absolute Monocytes: 753 cells/uL (ref 200–950)
Basophils Absolute: 102 cells/uL (ref 0–200)
Basophils Relative: 1.1 %
Eosinophils Absolute: 409 cells/uL (ref 15–500)
Eosinophils Relative: 4.4 %
HCT: 36.7 % (ref 35.0–45.0)
Hemoglobin: 11.2 g/dL — ABNORMAL LOW (ref 11.7–15.5)
Lymphs Abs: 2409 cells/uL (ref 850–3900)
MCH: 21.5 pg — ABNORMAL LOW (ref 27.0–33.0)
MCHC: 30.5 g/dL — ABNORMAL LOW (ref 32.0–36.0)
MCV: 70.6 fL — ABNORMAL LOW (ref 80.0–100.0)
MPV: 11.8 fL (ref 7.5–12.5)
Monocytes Relative: 8.1 %
Neutro Abs: 5627 cells/uL (ref 1500–7800)
Neutrophils Relative %: 60.5 %
Platelets: 520 10*3/uL — ABNORMAL HIGH (ref 140–400)
RBC: 5.2 10*6/uL — ABNORMAL HIGH (ref 3.80–5.10)
RDW: 16.1 % — ABNORMAL HIGH (ref 11.0–15.0)
Total Lymphocyte: 25.9 %
WBC: 9.3 10*3/uL (ref 3.8–10.8)

## 2022-09-16 MED ORDER — LOSARTAN POTASSIUM-HCTZ 100-25 MG PO TABS: 1.0000 | ORAL_TABLET | Freq: Every day | ORAL | 3 refills | Status: DC

## 2022-09-16 MED ORDER — METHOCARBAMOL 500 MG PO TABS: 500.0000 mg | ORAL_TABLET | Freq: Every evening | ORAL | 0 refills | Status: DC | PRN

## 2022-09-16 NOTE — Progress Notes (Signed)
Michelle Mercer is a 57 y.o. female with the following history as recorded in EpicCare:  Patient Active Problem List   Diagnosis Date Noted   Type 2 diabetes mellitus with hyperglycemia, with long-term current use of insulin (HCC) 04/19/2022   Iron deficiency anemia, unspecified 07/10/2012   Type II or unspecified type diabetes mellitus without mention of complication, not stated as uncontrolled 07/10/2012    Current Outpatient Medications  Medication Sig Dispense Refill   albuterol (VENTOLIN HFA) 108 (90 Base) MCG/ACT inhaler Inhale 2 puffs into the lungs every 6 (six) hours as needed for wheezing. 18 g 0   amLODipine (NORVASC) 5 MG tablet TAKE 1 TABLET (5 MG TOTAL) BY MOUTH DAILY. 90 tablet 0   aspirin EC ([facility] ASPIRIN LOW DOSE) 81 MG tablet Take 1 tablet (81 mg total) by mouth daily. 90 tablet 0   Cholecalciferol (VITAMIN D-3) 125 MCG (5000 UT) TABS Take 1 tablet by mouth daily.     Continuous Glucose Transmitter (DEXCOM G6 TRANSMITTER) MISC Change every 90 days 1 each 3   Cyanocobalamin (VITAMIN B 12 PO) Take by mouth every morning.     dapagliflozin propanediol (FARXIGA) 10 MG TABS tablet Take 1 tablet (10 mg total) by mouth daily before breakfast. 90 tablet 2   Dulaglutide (TRULICITY) 3 MG/0.5ML SOPN Inject 3 mg as directed once a week. 6 mL 2   famotidine (PEPCID) 20 MG tablet Take 1 tablet (20 mg total) by mouth 2 (two) times daily. 60 tablet 0   Insulin Glargine (BASAGLAR KWIKPEN) 100 UNIT/ML Inject 70 Units into the skin daily. 75 mL 2   insulin lispro (HUMALOG KWIKPEN) 100 UNIT/ML KwikPen Max daily 30 units per scale 30 mL 2   Insulin Pen Needle (B-D ULTRAFINE III SHORT PEN) 31G X 8 MM MISC Inject 1 Device into the skin in the morning, at noon, in the evening, and at bedtime. 400 each 2   methocarbamol (ROBAXIN) 500 MG tablet Take 1 tablet (500 mg total) by mouth at bedtime as needed for muscle spasms. 30 tablet 0   pioglitazone (ACTOS) 30 MG tablet Take 1 tablet (30 mg total) by  mouth daily. 90 tablet 2   Continuous Blood Gluc Sensor (DEXCOM G7 SENSOR) MISC 1 Device by Does not apply route as directed. 9 each 3   losartan-hydrochlorothiazide (HYZAAR) 100-25 MG tablet Take 1 tablet by mouth daily. 90 tablet 3   promethazine-dextromethorphan (PROMETHAZINE-DM) 6.25-15 MG/5ML syrup Take 5 mLs by mouth 4 (four) times daily as needed for cough. 118 mL 0   No current facility-administered medications for this visit.    Allergies: Iodine, Lisinopril, Saxagliptin-metformin er, Lansoprazole, Latex, and Sulfamethoxazole-trimethoprim  Past Medical History:  Diagnosis Date   Diabetes mellitus without complication (HCC)    Hypertension    Iron deficiency anemia, unspecified 07/10/2012   Type II or unspecified type diabetes mellitus without mention of complication, not stated as uncontrolled 07/10/2012    Past Surgical History:  Procedure Laterality Date   BACK SURGERY     CESAREAN SECTION     CHOLECYSTECTOMY     TUBAL LIGATION      Family History  Problem Relation Age of Onset   Diabetes Mother    Hypertension Father    Heart disease Father    Diabetes Father    Asthma Sister    Asthma Sister     Social History   Tobacco Use   Smoking status: Never   Smokeless tobacco: Never  Substance Use Topics   Alcohol  use: Yes    Comment: occasional    Subjective:   2 week history of left calf pain- describes as "aching pain"; no known injury or trauma; some swelling noted bilaterally in lower ankles but admits has been out of her blood pressure medication x 5 days; no cough, chest pain or shortness of breath;    Objective:  Vitals:   09/16/22 1436  BP: (!) 157/68  Pulse: 70  SpO2: 100%  Weight: 210 lb 3.2 oz (95.3 kg)  Height: 5\' 1"  (1.549 m)    General: Well developed, well nourished, in no acute distress  Skin : Warm and dry.  Head: Normocephalic and atraumatic  Eyes: Sclera and conjunctiva clear; pupils round and reactive to light; extraocular movements  intact  Ears: External normal; canals clear; tympanic membranes normal  Oropharynx: Pink, supple. No suspicious lesions  Neck: Supple without thyromegaly, adenopathy  Lungs: Respirations unlabored; clear to auscultation bilaterally without wheeze, rales, rhonchi  CVS exam: normal rate and regular rhythm.  Musculoskeletal: No deformities; no active joint inflammation; no warmth, swelling noted over left calf;   Extremities: bilateral pitting edema, cyanosis, clubbing  Vessels: Symmetric bilaterally  Neurologic: Alert and oriented; speech intact; face symmetrical; moves all extremities well; CNII-XII intact without focal deficit   Assessment:  1. Pain of left lower extremity   2. Primary hypertension     Plan:  Suspect muscular source- patient has had no relief with NSAIDs and defers Mobic; trial of Robaxin at bedtime prn; encouraged to apply heat to affected area;       Check CBC, CMP, d-dimer and vascular ultrasound to rule out DVT; follow up to be determined;  2.   Uncontrolled- patient has not been on medication x 5 days; refill updated;   No follow-ups on file.  Orders Placed This Encounter  Procedures   CBC with Differential/Platelet   Comp Met (CMET)   D-Dimer, Quantitative    Requested Prescriptions   Signed Prescriptions Disp Refills   losartan-hydrochlorothiazide (HYZAAR) 100-25 MG tablet 90 tablet 3    Sig: Take 1 tablet by mouth daily.   methocarbamol (ROBAXIN) 500 MG tablet 30 tablet 0    Sig: Take 1 tablet (500 mg total) by mouth at bedtime as needed for muscle spasms.

## 2022-09-26 ENCOUNTER — Telehealth: Payer: Self-pay | Admitting: Family

## 2022-09-26 ENCOUNTER — Ambulatory Visit (HOSPITAL_COMMUNITY)
Admission: RE | Admit: 2022-09-26 | Discharge: 2022-09-26 | Disposition: A | Discharge status: Home / Self Care | Payer: 59 | Source: Ambulatory Visit | Attending: Family | Admitting: Family

## 2022-09-26 DIAGNOSIS — M79605 Pain in left leg: Secondary | ICD-10-CM | POA: Diagnosis not present

## 2022-09-26 NOTE — Progress Notes (Signed)
Left lower extremity venous duplex has been completed. Preliminary results can be found in CV Proc through chart review.  Results were given to Windell Moulding at [facility] FNP office.  09/26/22 9:52 AM Olen Cordial RVT

## 2022-09-26 NOTE — Telephone Encounter (Signed)
Tammy Sours from [facility] spoke with Windell Moulding to discuss pt's results.

## 2022-09-28 ENCOUNTER — Other Ambulatory Visit: Payer: Self-pay | Admitting: Family

## 2022-10-05 ENCOUNTER — Encounter: Payer: Self-pay | Admitting: Internal Medicine

## 2022-10-06 NOTE — Telephone Encounter (Signed)
Please advise on Alternative in Dr. Lonzo Cloud absence

## 2022-10-11 ENCOUNTER — Other Ambulatory Visit: Payer: Self-pay | Admitting: Internal Medicine

## 2022-10-11 MED ORDER — SEMAGLUTIDE (1 MG/DOSE) 4 MG/3ML ~~LOC~~ SOPN: 1.0000 mg | PEN_INJECTOR | SUBCUTANEOUS | 11 refills | Status: DC

## 2022-10-11 NOTE — Addendum Note (Signed)
Addended by: Scarlette Shorts on: 10/11/2022 01:17 PM   Modules accepted: Orders

## 2022-10-20 ENCOUNTER — Other Ambulatory Visit: Payer: Self-pay | Admitting: Internal Medicine

## 2022-10-20 ENCOUNTER — Ambulatory Visit: Payer: 59 | Admitting: Internal Medicine

## 2022-10-20 ENCOUNTER — Other Ambulatory Visit (HOSPITAL_COMMUNITY): Payer: Self-pay

## 2022-10-20 ENCOUNTER — Encounter: Payer: Self-pay | Admitting: Internal Medicine

## 2022-10-20 VITALS — BP 130/73 | HR 63 | Ht 61.0 in | Wt 209.0 lb

## 2022-10-20 DIAGNOSIS — E1165 Type 2 diabetes mellitus with hyperglycemia: Secondary | ICD-10-CM

## 2022-10-20 DIAGNOSIS — Z794 Long term (current) use of insulin: Secondary | ICD-10-CM

## 2022-10-20 DIAGNOSIS — Z7985 Long-term (current) use of injectable non-insulin antidiabetic drugs: Secondary | ICD-10-CM

## 2022-10-20 DIAGNOSIS — Z7984 Long term (current) use of oral hypoglycemic drugs: Secondary | ICD-10-CM | POA: Diagnosis not present

## 2022-10-20 LAB — GLUCOSE, POCT (MANUAL RESULT ENTRY): POC Glucose: 192 mg/dL — AB (ref 70–99)

## 2022-10-20 LAB — POCT GLYCOSYLATED HEMOGLOBIN (HGB A1C): Hemoglobin A1C: 8.7 % — AB (ref 4.0–5.6)

## 2022-10-20 MED ORDER — TRESIBA FLEXTOUCH 200 UNIT/ML ~~LOC~~ SOPN: 74.0000 [IU] | PEN_INJECTOR | Freq: Every day | SUBCUTANEOUS | 3 refills | Status: DC

## 2022-10-20 MED ORDER — TIRZEPATIDE 5 MG/0.5ML ~~LOC~~ SOAJ: 5.0000 mg | SUBCUTANEOUS | 3 refills | Status: DC

## 2022-10-20 MED ORDER — INSULIN LISPRO (1 UNIT DIAL) 100 UNIT/ML (KWIKPEN): PEN_INJECTOR | SUBCUTANEOUS | 2 refills | Status: DC

## 2022-10-20 MED ORDER — DAPAGLIFLOZIN PROPANEDIOL 10 MG PO TABS: 10.0000 mg | ORAL_TABLET | Freq: Every day | ORAL | 2 refills | Status: DC

## 2022-10-20 MED ORDER — PIOGLITAZONE HCL 30 MG PO TABS: 30.0000 mg | ORAL_TABLET | Freq: Every day | ORAL | 2 refills | Status: DC

## 2022-10-20 MED ORDER — INSULIN PEN NEEDLE 32G X 4 MM MISC: 1.0000 | Freq: Four times a day (QID) | 3 refills | Status: DC

## 2022-10-20 MED ORDER — BASAGLAR KWIKPEN 100 UNIT/ML ~~LOC~~ SOPN: 74.0000 [IU] | PEN_INJECTOR | Freq: Every day | SUBCUTANEOUS | 2 refills | Status: DC

## 2022-10-20 NOTE — Patient Instructions (Addendum)
-   Switch to Trulicity to mounjaro 5 mg weekly  - Continue Farxiga 10 mg, 1 tablet every morning  - Increase Basaglar  74 units once daily  - Continue  Actos (Pioglitazone ) 30 mg daily  - Humalog correctional insulin: ADD extra units on insulin to your meal-time Humalog dose if your blood sugars are higher than 150. Use the scale below to help guide you:   Blood sugar before meal Number of units to inject  Less than 150 0 unit  151 -  170 1 units  171 -  190 2 units  191 -  210 3 units  211 -  230 4 units  231 -  250 5 units  251 -  270 6 units  271 -  290 7 units  291 -  310 8 units  311 - 330 9 units    HOW TO TREAT LOW BLOOD SUGARS (Blood sugar LESS THAN 70 MG/DL) Please follow the RULE OF 15 for the treatment of hypoglycemia treatment (when your (blood sugars are less than 70 mg/dL)   STEP 1: Take 15 grams of carbohydrates when your blood sugar is low, which includes:  3-4 GLUCOSE TABS  OR 3-4 OZ OF JUICE OR REGULAR SODA OR ONE TUBE OF GLUCOSE GEL    STEP 2: RECHECK blood sugar in 15 MINUTES STEP 3: If your blood sugar is still low at the 15 minute recheck --> then, go back to STEP 1 and treat AGAIN with another 15 grams of carbohydrates.

## 2022-10-20 NOTE — Progress Notes (Signed)
Name: Michelle Mercer  Age/ Sex: 57 y.o., female   MRN/ DOB: [id number], [birth year]     PCP: Olive Bass, FNP   Reason for Endocrinology Evaluation: Type 2 Diabetes Mellitus  Initial Endocrine Consultative Visit: 08/11/2020    PATIENT IDENTIFIER: Michelle Mercer is a 57 y.o. female with a past medical history of T2DM, HTN and dyslipidemia . The patient has followed with Endocrinology clinic since 08/11/2020 for consultative assistance with management of her diabetes.  DIABETIC HISTORY:  Michelle Mercer was diagnosed with DM in her 21's, Metformin - diarrhea . Her hemoglobin A1c has ranged from 7.1% in 2021, peaking at 15.0% in 2020.  On her initial visit to our clinic she had an A1c of 7.7%  , she was on Jardiance, Ozempic, Pioglitazone and MDI regimen   SUBJECTIVE:   During the last visit (04/19/2022): A1c 10.6 %     Today (10/20/2022): Michelle Mercer is here for a follow up on diabetes management.  She has been checking glucose multiple times daily through CGM.   GLP-1 agonist have been cost prohibitive with a co-pay of $300  She has been noted with constipation  Denies nausea or vomiting   DIABETES REGIMEN:  Farxiga 10 mg, 1 tablet every morning  Trulicity 3 mg weekly - not taking  Basaglar  70 units once daily  Pioglitazone 30 mg daily  CF: Humalog (BG -130/20)   Statin: no ACE-I/ARB: yes    METER DOWNLOAD SUMMARY:   DIABETIC COMPLICATIONS: Microvascular complications:   Denies: CKD, retinopathy neuropathy Last Eye Exam: Completed > 1 yr ago   Macrovascular complications:   Denies: CAD, CVA, PVD   HISTORY:  Past Medical History:  Past Medical History:  Diagnosis Date   Diabetes mellitus without complication (HCC)    Hypertension    Iron deficiency anemia, unspecified 07/10/2012   Type II or unspecified type diabetes mellitus without mention of complication, not stated as uncontrolled 07/10/2012   Past Surgical History:  Past Surgical History:   Procedure Laterality Date   BACK SURGERY     CESAREAN SECTION     CHOLECYSTECTOMY     TUBAL LIGATION     Social History:  reports that she has never smoked. She has never used smokeless tobacco. She reports current alcohol use. She reports that she does not use drugs. Family History:  Family History  Problem Relation Age of Onset   Diabetes Mother    Hypertension Father    Heart disease Father    Diabetes Father    Asthma Sister    Asthma Sister      HOME MEDICATIONS: Allergies as of 10/20/2022       Reactions   Iodine Itching   Patient not allergic to CT contrast. Had CT IV contrast on 12/07/21 (authorized by Dr. Jacqulyn Bath) Patient states she is allergic to iodine but not allergic to CT IV contrast. Patient had no reaction to the CT IV contrast on 12/07/21.    Lisinopril Nausea And Vomiting, Cough   Saxagliptin-metformin Er Diarrhea   Lansoprazole Hives, Nausea Only   Latex Rash   Sulfamethoxazole-trimethoprim Rash        Medication List        Accurate as of October 20, 2022  8:21 AM. If you have any questions, ask your nurse or doctor.          STOP taking these medications    promethazine-dextromethorphan 6.25-15 MG/5ML syrup Commonly known as: PROMETHAZINE-DM Stopped by: Johnney Ou Arionne Iams  TAKE these medications    albuterol 108 (90 Base) MCG/ACT inhaler Commonly known as: VENTOLIN HFA Inhale 2 puffs into the lungs every 6 (six) hours as needed for wheezing.   amLODipine 5 MG tablet Commonly known as: NORVASC TAKE 1 TABLET (5 MG TOTAL) BY MOUTH DAILY.   aspirin EC 81 MG tablet Commonly known as: [facility]S Aspirin Low Dose Take 1 tablet (81 mg total) by mouth daily.   B-D ULTRAFINE III SHORT PEN 31G X 8 MM Misc Generic drug: Insulin Pen Needle Inject 1 Device into the skin in the morning, at noon, in the evening, and at bedtime.   Basaglar KwikPen 100 UNIT/ML Inject 70 Units into the skin daily.   dapagliflozin propanediol 10 MG Tabs  tablet Commonly known as: Farxiga Take 1 tablet (10 mg total) by mouth daily before breakfast.   Dexcom G6 Transmitter Misc Change every 90 days   famotidine 20 MG tablet Commonly known as: PEPCID Take 1 tablet (20 mg total) by mouth 2 (two) times daily.   insulin lispro 100 UNIT/ML KwikPen Commonly known as: HumaLOG KwikPen Max daily 30 units per scale   losartan-hydrochlorothiazide 100-25 MG tablet Commonly known as: HYZAAR Take 1 tablet by mouth daily.   methocarbamol 500 MG tablet Commonly known as: ROBAXIN Take 1 tablet (500 mg total) by mouth at bedtime as needed for muscle spasms.   pioglitazone 30 MG tablet Commonly known as: ACTOS Take 1 tablet (30 mg total) by mouth daily.   Semaglutide (1 MG/DOSE) 4 MG/3ML Sopn Inject 1 mg as directed once a week.   VITAMIN B 12 PO Take by mouth every morning.   Vitamin D-3 125 MCG (5000 UT) Tabs Take 1 tablet by mouth daily.         OBJECTIVE:   Vital Signs: BP 130/73 (BP Location: Left Arm, Patient Position: Sitting, Cuff Size: Large)   Pulse 63   Ht 5\' 1"  (1.549 m)   Wt 209 lb (94.8 kg)   SpO2 99%   BMI 39.49 kg/m   Wt Readings from Last 3 Encounters:  10/20/22 209 lb (94.8 kg)  09/16/22 210 lb 3.2 oz (95.3 kg)  04/19/22 207 lb (93.9 kg)     Exam: General: Pt appears well and is in NAD  Lungs: Clear with good BS bilat   Heart: RRR  Extremities: No pretibial edema.   Neuro: MS is good with appropriate affect, pt is alert and Ox3    DM foot exam: 04/19/2022  The skin of the feet is intact without sores or ulcerations. The pedal pulses are 2+ on right and 2+ on left. The sensation is intact to a screening 5.07, 10 gram monofilament bilaterally        DATA REVIEWED:  Lab Results  Component Value Date   HGBA1C 8.7 (A) 10/20/2022   HGBA1C 10.6 (A) 04/19/2022   HGBA1C 9.6 (H) 07/30/2021     Latest Reference Range & Units 09/16/22 15:15  Sodium 135 - 146 mmol/L 141  Potassium 3.5 - 5.3 mmol/L  4.2  Chloride 98 - 110 mmol/L 106  CO2 20 - 32 mmol/L 27  Glucose 65 - 99 mg/dL 80  BUN 7 - 25 mg/dL 13  Creatinine 0.86 - 5.78 mg/dL 4.69  Calcium 8.6 - 62.9 mg/dL 9.6  BUN/Creatinine Ratio 6 - 22 (calc) SEE NOTE:  AG Ratio 1.0 - 2.5 (calc) 1.3  AST 10 - 35 U/L 10  ALT 6 - 29 U/L 9  Total Protein 6.1 - 8.1  g/dL 7.2  Total Bilirubin 0.2 - 1.2 mg/dL 0.2     ASSESSMENT / PLAN / RECOMMENDATIONS:   1) Type 2 Diabetes Mellitus, Poorly controlled, With microalbuminuria  - Most recent A1c of 8.7%. Goal A1c < 7.0 %.     -A1c down from 10.6% to 8.7% -Unfortunately she has not been able to get Trulicity no Ozempic, the cost is $300.  She did receive manufacturing coupon for $25, but somehow the pharmacy took only $25 from the cost?  -I did suggest trying [facility], she was provided with a coupon -In office BG 192 Mg/DL, fasting, I will increase Basaglar as below -[facility] [place] [facility] pen [facility] -She was given instructions to download clarity app so we are able to download Dexcom on next visit  MEDICATIONS: Switch Trulicity to [employer] 5 mg weekly Farxiga 10 mg, 1 tablet every morning  Increase Basaglar 74 units once daily  Actos (Pioglitazone ) 30 mg daily  Correction Scale: Humalog (BG -130/20) 3 times daily before every meal  EDUCATION / INSTRUCTIONS: BG monitoring instructions: Patient is instructed to check her blood sugars 3 times a day, before each meal . Call [facility] if: BG persistently < 70  I reviewed the Rule of 15 for the treatment of hypoglycemia in detail with the patient. Literature supplied.    2) Diabetic complications:  Eye: Does not have known diabetic retinopathy.  Neuro/ Feet: Does not have known diabetic peripheral neuropathy .  Renal: Patient does not have known baseline CKD. She   is  on an ACEI/ARB at present.     F/U in 4 months     Signed electronically by: Lyndle Herrlich,  MD  [facility]  [facility] [address] [place], [place] [address] Phone: [phone] FAX: [phone]   CC: Olive Bass, FNP [address] [place] [address] Phone: [phone]  Fax: [phone]  Return to Endocrinology clinic as below: No future appointments.

## 2022-10-20 NOTE — Addendum Note (Signed)
Addended by: Scarlette Shorts on: 10/20/2022 11:55 AM   Modules accepted: Orders

## 2022-10-24 ENCOUNTER — Telehealth: Payer: Self-pay

## 2022-10-24 ENCOUNTER — Other Ambulatory Visit (HOSPITAL_COMMUNITY): Payer: Self-pay

## 2022-10-24 NOTE — Telephone Encounter (Signed)
Pharmacy Patient Advocate Encounter   Received notification from RX Request Messages that prior authorization for [facility] is required/requested.   Insurance verification completed.   The patient is insured through [facility] .   Per test claim: Insurance prefers Trulicity, however, due to a high deductable, her copay would be $932.66.   I can still do the PA for [facility] since I see she has tried the alternatives in the past but her deductible will have to be met before the amount would be lower. Please let me know if you would like me to continue with the PA for [facility].   Current remaining deductible: [phone]

## 2022-11-08 ENCOUNTER — Other Ambulatory Visit (HOSPITAL_COMMUNITY): Payer: Self-pay

## 2022-11-08 NOTE — Telephone Encounter (Signed)
Pharmacy Patient Advocate Encounter  Received notification from [employer] that Prior Authorization for Michelle Mercer has been APPROVED from 11/08/22 to 11/08/23. Ran test claim, Copay is $25. This test claim was processed through [facility]- copay amounts may vary at other pharmacies due to pharmacy/plan contracts, or as the patient moves through the different stages of their insurance plan.

## 2022-11-08 NOTE — Telephone Encounter (Signed)
Requested new PA with the info that the pt had taken Victoza previously too. KEY: [facility] PA pending.

## 2022-11-08 NOTE — Telephone Encounter (Signed)
Pharmacy Patient Advocate Encounter  PA sent.  PA required; PA submitted to [facility] via CoverMyMeds Key/confirmation #/[id number] Status is pending

## 2022-11-08 NOTE — Telephone Encounter (Signed)
Pharmacy Patient Advocate Encounter  Received notification from [facility] that Prior Authorization for Greggory Keen has been DENIED.  Full denial letter will be uploaded to the media tab. See denial reason below.   PA #/Case ID/Reference #: PA Case ID #: [id number]   I see that it looks like she has taken Victoza in the past as well, but I'm not able to find why she stopped taking it. If you can get me that information, we can send that in and try to get the decision over turned.

## 2022-11-09 NOTE — Telephone Encounter (Signed)
[facility] will contact [facility] and see how much the Greggory Keen is now that it has been approved and let us know

## 2022-11-22 IMAGING — MG MM DIGITAL SCREENING BILAT W/ TOMO AND CAD
6 of 12 series · 6 of 36 positions shown · non-contrast
Comparison: Previous exam(s).

CLINICAL DATA: Screening.

EXAM:
DIGITAL SCREENING BILATERAL MAMMOGRAM WITH TOMOSYNTHESIS AND CAD
TECHNIQUE: Bilateral screening digital craniocaudal and mediolateral oblique
mammograms were obtained. Bilateral screening digital breast
tomosynthesis was performed. The images were evaluated with
computer-aided detection.

[R CC synth-2D]
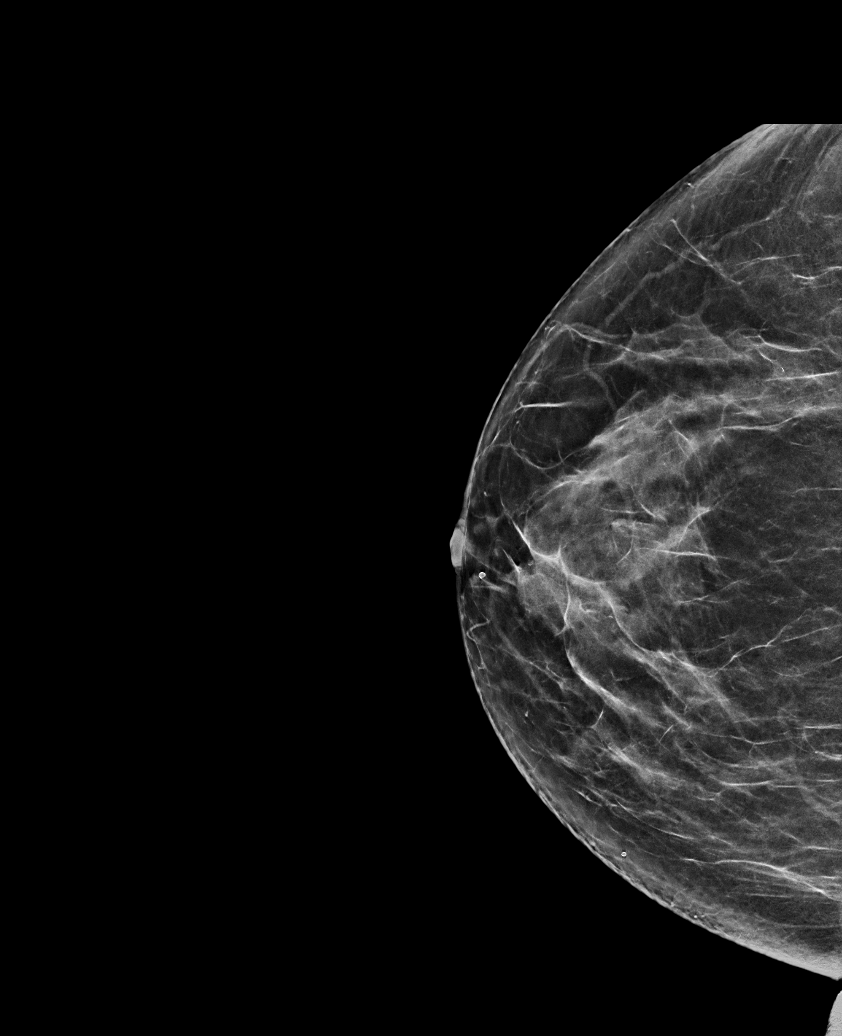

[R MLO synth-2D (1 of 2)]
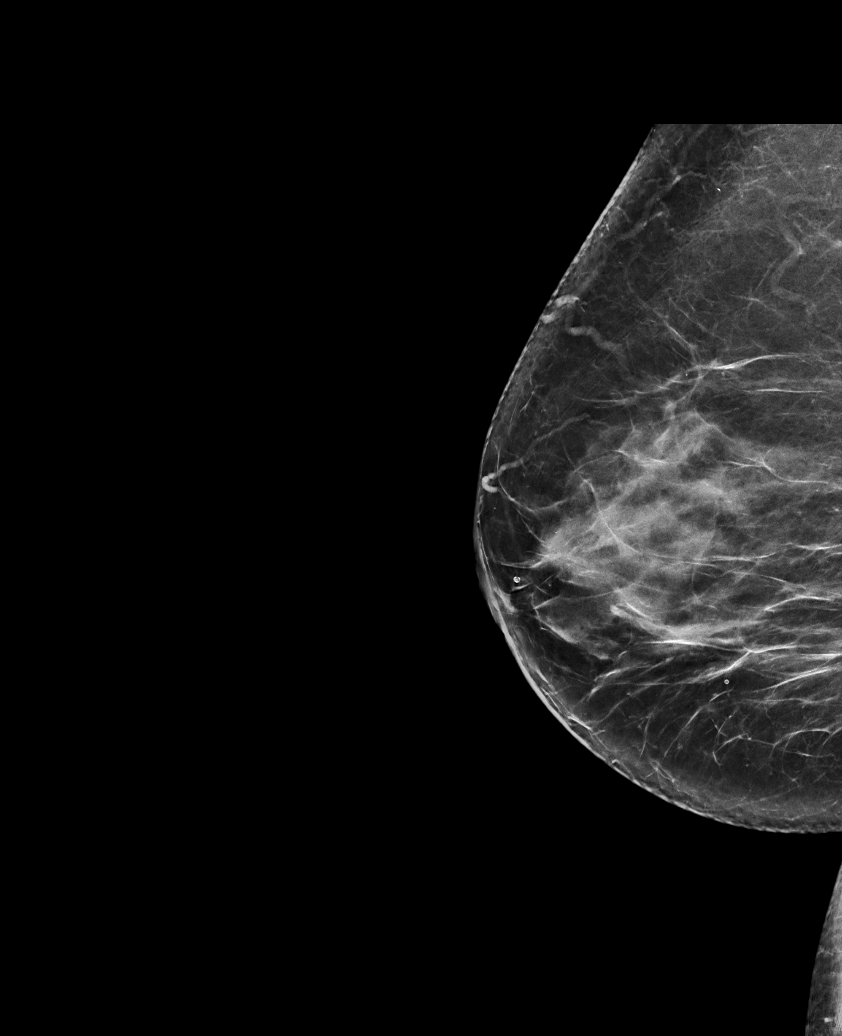

[L MLO synth-2D (1 of 2)]
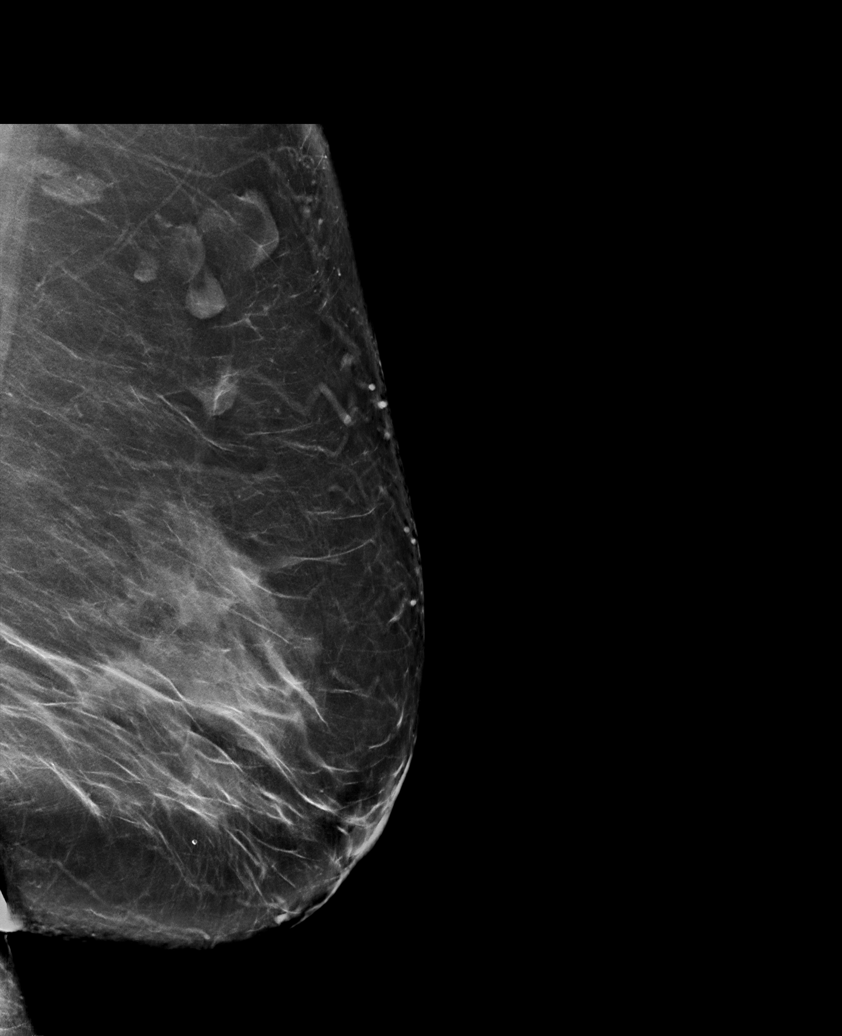

[R MLO synth-2D (2 of 2)]
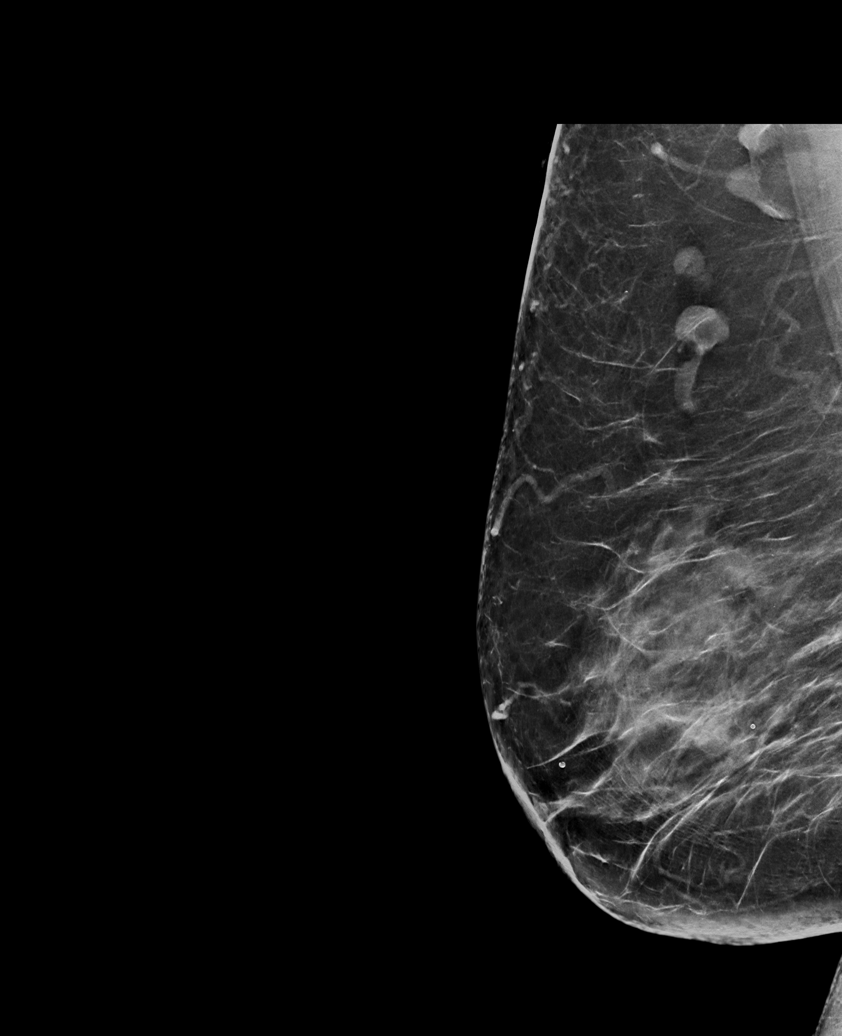

[L CC synth-2D]
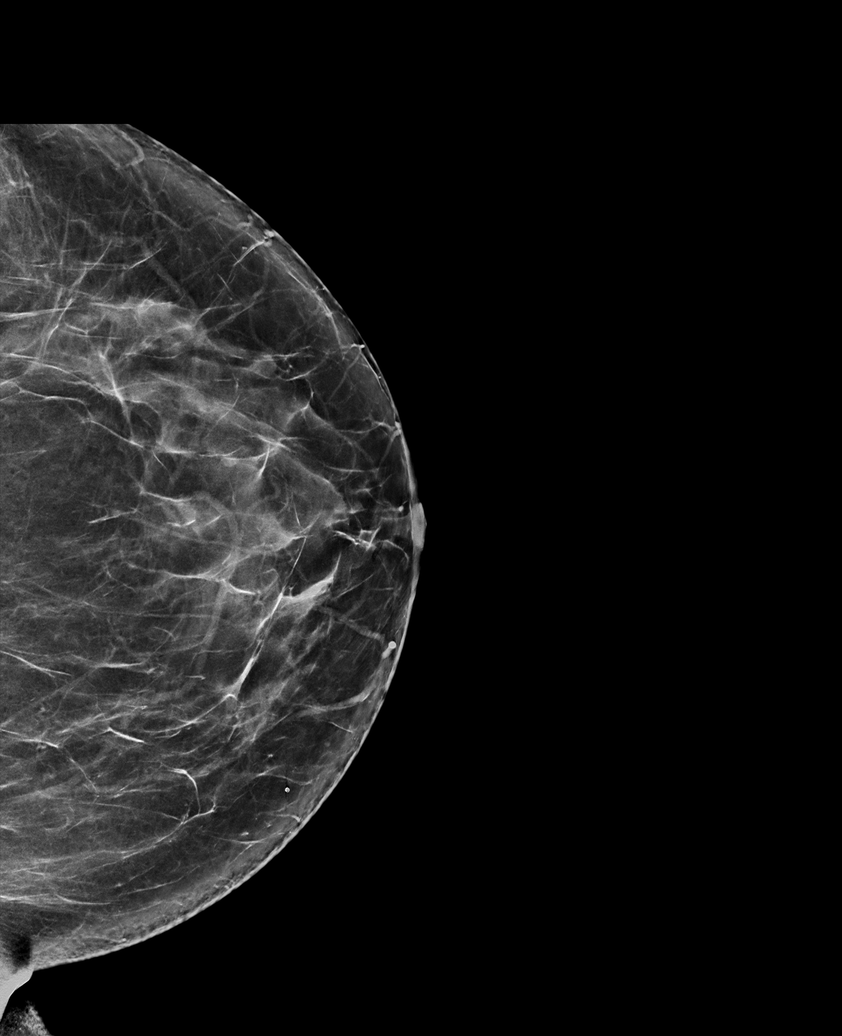

[L MLO synth-2D (2 of 2)]
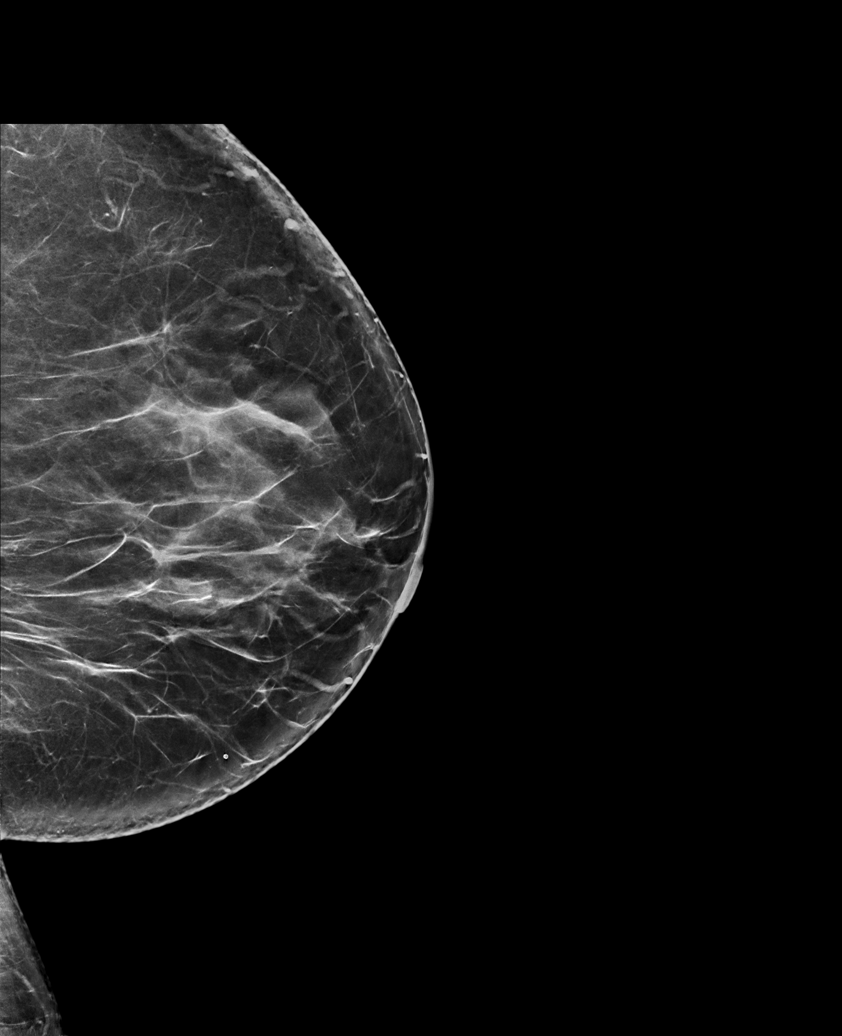

[6 of 36 positions shown; findings below may reference images not displayed]

ACR Breast Density Category c: The breast tissue is heterogeneously
dense, which may obscure small masses.
FINDINGS: There are no findings suspicious for malignancy.
IMPRESSION: No mammographic evidence of malignancy. A result letter of this
screening mammogram will be mailed directly to the patient.

RECOMMENDATION:
Screening mammogram in one year. (Code:Q3-W-BC3)

BI-RADS CATEGORY  1: Negative.

## 2022-11-23 ENCOUNTER — Emergency Department (HOSPITAL_BASED_OUTPATIENT_CLINIC_OR_DEPARTMENT_OTHER)
Admission: EM | Admit: 2022-11-23 | Discharge: 2022-11-23 | Disposition: A | Discharge status: Home / Self Care | Payer: 59 | Attending: Emergency Medicine | Admitting: Emergency Medicine

## 2022-11-23 ENCOUNTER — Other Ambulatory Visit: Payer: Self-pay

## 2022-11-23 DIAGNOSIS — Z794 Long term (current) use of insulin: Secondary | ICD-10-CM | POA: Diagnosis not present

## 2022-11-23 DIAGNOSIS — E1165 Type 2 diabetes mellitus with hyperglycemia: Secondary | ICD-10-CM | POA: Diagnosis not present

## 2022-11-23 DIAGNOSIS — R739 Hyperglycemia, unspecified: Secondary | ICD-10-CM

## 2022-11-23 DIAGNOSIS — E871 Hypo-osmolality and hyponatremia: Secondary | ICD-10-CM | POA: Insufficient documentation

## 2022-11-23 DIAGNOSIS — Z7984 Long term (current) use of oral hypoglycemic drugs: Secondary | ICD-10-CM | POA: Diagnosis not present

## 2022-11-23 DIAGNOSIS — Z7982 Long term (current) use of aspirin: Secondary | ICD-10-CM | POA: Insufficient documentation

## 2022-11-23 DIAGNOSIS — Z91040 Latex allergy status: Secondary | ICD-10-CM | POA: Insufficient documentation

## 2022-11-23 LAB — BASIC METABOLIC PANEL
Anion gap: 8 (ref 5–15)
BUN: 25 mg/dL — ABNORMAL HIGH (ref 6–20)
CO2: 29 mmol/L (ref 22–32)
Calcium: 9.2 mg/dL (ref 8.9–10.3)
Chloride: 95 mmol/L — ABNORMAL LOW (ref 98–111)
Creatinine, Ser: 1.06 mg/dL — ABNORMAL HIGH (ref 0.44–1.00)
GFR, Estimated: >60 mL/min (ref >60)
Glucose, Bld: 312 mg/dL — ABNORMAL HIGH (ref 70–99)
Potassium: 4.2 mmol/L (ref 3.5–5.1)
Sodium: 132 mmol/L — ABNORMAL LOW (ref 135–145)

## 2022-11-23 LAB — CBG MONITORING, ED: Glucose-Capillary: 299 mg/dL — ABNORMAL HIGH (ref 70–99)

## 2022-11-23 MED ORDER — ONDANSETRON 4 MG PO TBDP
8.0000 mg | ORAL_TABLET | Freq: Once | ORAL | Status: AC
  Administered 2022-11-23: 8 mg via ORAL
  Filled 2022-11-23: qty 2

## 2022-11-23 NOTE — Discharge Instructions (Addendum)
Ms Michelle Mercer, Michelle Mercer were evaluated today for hyperlycemia/ high blood sugar. Fortunately, you do not show signs of a hyperglycemic emergency and are safe to return home. We suggest that you increase your long acting insulin to 80, but also call your endocrinologist as soon as possible to go over your medicines and arrange an ER follow up visit. Also ask them about a medicine called "Farxiga" which I believe they would like you to be taking. If you develop severe abdominal pain or vomiting that will not go away, return for evaluation.

## 2022-11-23 NOTE — ED Triage Notes (Signed)
Pt reports being weak, nauseas, dizzy and exhausted with glucose readings over 400 for the past month. States she "doesn't feel good overall".  Pt reports she has been unable to afford her diabetic medication that is over $300 monthly. Also states another medication is out of stock. Due to this she has not taken any diabetes medication for one month. States she is very worried about her sugars being uncontrolled.

## 2022-11-23 NOTE — ED Provider Notes (Signed)
[place] [facility] Provider Note   CSN: [id number] Arrival date & time: 11/23/22  0751     History  Chief Complaint  Patient presents with   Hyperglycemia    Michelle Mercer is a 57 y.o. female.  Michelle Mercer is a 57 yo F with PMH T2DM on insulin presenting for hyperglycemia and nausea/fatigue/dizziness x 2 weeks. She reports home glucose above 400 this a.m. from her CGM, and that her sugars typically increase overnight. She also reports polyuria but no polydipsia. She does not currently have access to the [facility] she is prescribed due to cost and she is unaware of the farxiga she is prescribed. She does continue to take long and short-acting insulin as well as pioglitazone. She is anxious today about her uncontrolled sugars, contributing to poor sleep. She does not have abdominal pain, chest pain, emesis, or dyspnea.   Hyperglycemia Associated symptoms: dizziness, fatigue and polyuria   Associated symptoms: no abdominal pain, no chest pain, no dysuria, no fever, no increased thirst, no nausea, no shortness of breath, no vomiting and no weakness        Home Medications Prior to Admission medications   Medication Sig Start Date End Date Taking? Authorizing Provider  albuterol (VENTOLIN HFA) 108 (90 Base) MCG/ACT inhaler Inhale 2 puffs into the lungs every 6 (six) hours as needed for wheezing. 01/14/21   Saguier, Ramon Dredge, PA-C  amLODipine (NORVASC) 5 MG tablet TAKE 1 TABLET (5 MG TOTAL) BY MOUTH DAILY. 07/25/22   Olive Bass, FNP  aspirin EC ([facility] ASPIRIN LOW DOSE) 81 MG tablet Take 1 tablet (81 mg total) by mouth daily. 08/08/22   Olive Bass, FNP  Cholecalciferol (VITAMIN D-3) 125 MCG (5000 UT) TABS Take 1 tablet by mouth daily.    [provider]  Continuous Glucose Transmitter (DEXCOM G6 TRANSMITTER) MISC Change every 90 days 08/24/22   Shamleffer, Konrad Dolores, MD  Cyanocobalamin (VITAMIN B 12 PO) Take by mouth  every morning.    [provider]  dapagliflozin propanediol (FARXIGA) 10 MG TABS tablet Take 1 tablet (10 mg total) by mouth daily before breakfast. 10/20/22   Shamleffer, Konrad Dolores, MD  famotidine (PEPCID) 20 MG tablet Take 1 tablet (20 mg total) by mouth 2 (two) times daily. 12/07/21   Small, Brooke L, PA  insulin degludec (TRESIBA FLEXTOUCH) 200 UNIT/ML FlexTouch Pen Inject 74 Units into the skin daily at 2 PM. 10/20/22   Shamleffer, Konrad Dolores, MD  insulin lispro (HUMALOG KWIKPEN) 100 UNIT/ML KwikPen Max daily 30 units per scale 10/20/22   Shamleffer, Konrad Dolores, MD  Insulin Pen Needle 32G X 4 MM MISC 1 Device by Does not apply route in the morning, at noon, in the evening, and at bedtime. 10/20/22   Shamleffer, Konrad Dolores, MD  losartan-hydrochlorothiazide (HYZAAR) 100-25 MG tablet Take 1 tablet by mouth daily. 09/16/22   Olive Bass, FNP  methocarbamol (ROBAXIN) 500 MG tablet Take 1 tablet (500 mg total) by mouth at bedtime as needed for muscle spasms. 09/16/22   Olive Bass, FNP  pioglitazone (ACTOS) 30 MG tablet Take 1 tablet (30 mg total) by mouth daily. 10/20/22   Shamleffer, Konrad Dolores, MD  tirzepatide [facility]) 5 MG/0.5ML Pen Inject 5 mg into the skin once a week. 10/20/22   Shamleffer, Konrad Dolores, MD      Allergies    Iodine, Lisinopril, Saxagliptin-metformin er, Lansoprazole, Latex, and Sulfamethoxazole-trimethoprim    Review of Systems   Review of Systems  Constitutional:  Positive for fatigue. Negative for chills and fever.  Respiratory:  Negative for cough and shortness of breath.   Cardiovascular:  Negative for chest pain and palpitations.  Gastrointestinal:  Negative for abdominal pain, constipation, diarrhea, nausea and vomiting.  Endocrine: Positive for polyuria. Negative for polydipsia.  Genitourinary:  Positive for frequency. Negative for dysuria and urgency.  Neurological:  Positive for dizziness. Negative for syncope,  weakness, light-headedness and headaches.  Psychiatric/Behavioral:  Positive for sleep disturbance.     Physical Exam Updated Vital Signs BP (!) 151/66   Pulse 65   Temp 98.5 F (36.9 C) (Oral)   Resp 18   SpO2 100%  Physical Exam Constitutional:      General: She is not in acute distress.    Appearance: Normal appearance. She is obese. She is not ill-appearing.  HENT:     Head: Normocephalic and atraumatic.  Cardiovascular:     Rate and Rhythm: Normal rate and regular rhythm.     Pulses: Normal pulses.     Heart sounds: Normal heart sounds.  Pulmonary:     Effort: Pulmonary effort is normal.     Breath sounds: Normal breath sounds.  Abdominal:     General: Abdomen is flat. Bowel sounds are normal.     Palpations: Abdomen is soft.     Tenderness: There is no abdominal tenderness.  Musculoskeletal:     Right lower leg: No edema.     Left lower leg: No edema.  Skin:    General: Skin is warm and dry.     Capillary Refill: Capillary refill takes less than 2 seconds.  Neurological:     General: No focal deficit present.     Mental Status: She is alert and oriented to person, place, and time.  Psychiatric:        Mood and Affect: Mood normal.        Behavior: Behavior normal.     ED Results / Procedures / Treatments   Labs (all labs ordered are listed, but only abnormal results are displayed) Labs Reviewed  BASIC METABOLIC PANEL - Abnormal; Notable for the following components:      Result Value   Sodium 132 (*)    Chloride 95 (*)    Glucose, Bld 312 (*)    BUN 25 (*)    Creatinine, Ser 1.06 (*)    All other components within normal limits  CBG MONITORING, ED - Abnormal; Notable for the following components:   Glucose-Capillary 299 (*)    All other components within normal limits    EKG None  Radiology No results found.  Procedures Procedures    Medications Ordered in ED Medications  ondansetron (ZOFRAN-ODT) disintegrating tablet 8 mg (8 mg Oral  Given 11/23/22 0907)    ED Course/ Medical Decision Making/ A&P                                 Medical Decision Making T2DM patient with some difficulty obtaining all her medicines presented with home glucose reading above 400 as well as two weeks of nausea and fatigue. Initial glucose per fingerstick in ED was 299 and then 312 on chemistries. No anion gap, mild hyponatremia (132), mild dehydration per BUN but clinically euvolemic. Discomfort was relieved with zoftan. Without acidosis and with stable sugars, she was ready for discharge home. As for her current regimen, she is able to take her insulin and Actos,  but Greggory Keen is cost prohibitive and she is not aware that she is prescribed Farxiga. We suggested she reach out to her endocrinologist and pharmacist about these discrepancies and she will today, she has had success reaching them in the past. We also suggested increasing long-acting insulin from 74 to 80 and continued monitoring of sugars, but asked to seek quick input from her endocrinology team.   Amount and/or Complexity of Data Reviewed Labs: ordered.  Risk Prescription drug management.          Final Clinical Impression(s) / ED Diagnoses Final diagnoses:  Hyperglycemia  Hyponatremia    Rx / DC Orders ED Discharge Orders     None         Katheran James, DO 11/23/22 1005    Maia Plan, MD 11/23/22 1055

## 2022-11-24 ENCOUNTER — Telehealth: Payer: Self-pay

## 2022-11-24 NOTE — Telephone Encounter (Signed)
LMTCB and sent mychart message

## 2022-11-24 NOTE — Telephone Encounter (Signed)
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 I was not able to download her Dexcom.  11/24/22  300 fasting 7am 343 9:00am   Patient is not able to get the [facility] due to out of pocket cost. She states that hospital advises her to increase her Tresiba to 4 but she wanted to verify with you before she does.     Tresiba 74 units Actos 1 tab daily Humalog has been 10 units with each meal because number have been higher than sliding scale Farxiga 1 tab daily  No Mounjaro

## 2022-11-25 ENCOUNTER — Encounter: Payer: Self-pay | Admitting: Family

## 2022-11-29 ENCOUNTER — Other Ambulatory Visit: Payer: Self-pay | Admitting: Family

## 2022-11-29 DIAGNOSIS — M79606 Pain in leg, unspecified: Secondary | ICD-10-CM

## 2022-12-01 ENCOUNTER — Other Ambulatory Visit: Payer: Self-pay | Admitting: Family

## 2022-12-16 DIAGNOSIS — G8929 Other chronic pain: Secondary | ICD-10-CM | POA: Diagnosis not present

## 2022-12-16 DIAGNOSIS — M5442 Lumbago with sciatica, left side: Secondary | ICD-10-CM | POA: Diagnosis not present

## 2022-12-16 DIAGNOSIS — M25562 Pain in left knee: Secondary | ICD-10-CM | POA: Diagnosis not present

## 2022-12-16 DIAGNOSIS — M79662 Pain in left lower leg: Secondary | ICD-10-CM | POA: Diagnosis not present

## 2023-01-19 ENCOUNTER — Ambulatory Visit: Payer: 59 | Admitting: Internal Medicine

## 2023-03-07 ENCOUNTER — Ambulatory Visit (INDEPENDENT_AMBULATORY_CARE_PROVIDER_SITE_OTHER): Payer: 59 | Admitting: Internal Medicine

## 2023-03-07 ENCOUNTER — Encounter: Payer: Self-pay | Admitting: Internal Medicine

## 2023-03-07 ENCOUNTER — Other Ambulatory Visit: Payer: Self-pay | Admitting: Internal Medicine

## 2023-03-07 VITALS — BP 140/80 | HR 70

## 2023-03-07 DIAGNOSIS — E1129 Type 2 diabetes mellitus with other diabetic kidney complication: Secondary | ICD-10-CM | POA: Insufficient documentation

## 2023-03-07 DIAGNOSIS — Z794 Long term (current) use of insulin: Secondary | ICD-10-CM | POA: Insufficient documentation

## 2023-03-07 DIAGNOSIS — R809 Proteinuria, unspecified: Secondary | ICD-10-CM

## 2023-03-07 DIAGNOSIS — E1165 Type 2 diabetes mellitus with hyperglycemia: Secondary | ICD-10-CM

## 2023-03-07 LAB — POCT GLYCOSYLATED HEMOGLOBIN (HGB A1C): Hemoglobin A1C: 11.3 % — AB (ref 4.0–5.6)

## 2023-03-07 LAB — GLUCOSE, POCT (MANUAL RESULT ENTRY): POC Glucose: 224 mg/dL — AB (ref 70–99)

## 2023-03-07 MED ORDER — PIOGLITAZONE HCL 30 MG PO TABS: 30.0000 mg | ORAL_TABLET | Freq: Every day | ORAL | 3 refills | Status: DC

## 2023-03-07 MED ORDER — INSULIN LISPRO (1 UNIT DIAL) 100 UNIT/ML (KWIKPEN): PEN_INJECTOR | SUBCUTANEOUS | 2 refills | Status: DC

## 2023-03-07 MED ORDER — TRESIBA FLEXTOUCH 200 UNIT/ML ~~LOC~~ SOPN: 90.0000 [IU] | PEN_INJECTOR | Freq: Every day | SUBCUTANEOUS | 4 refills | Status: DC

## 2023-03-07 MED ORDER — DEXCOM G7 SENSOR MISC: 1.0000 | 3 refills | Status: DC

## 2023-03-07 MED ORDER — INSULIN PEN NEEDLE 32G X 4 MM MISC: 1.0000 | Freq: Four times a day (QID) | 3 refills | Status: DC

## 2023-03-07 MED ORDER — DAPAGLIFLOZIN PROPANEDIOL 10 MG PO TABS: 10.0000 mg | ORAL_TABLET | Freq: Every day | ORAL | 2 refills | Status: DC

## 2023-03-07 MED ORDER — TIRZEPATIDE 5 MG/0.5ML ~~LOC~~ SOAJ: 5.0000 mg | SUBCUTANEOUS | 3 refills | Status: DC

## 2023-03-07 NOTE — Progress Notes (Signed)
Name: Michelle Mercer  Age/ Sex: 58 y.o., female   MRN/ DOB: [id number], [birth year]     PCP: Michelle Bass, FNP   Reason for Endocrinology Evaluation: Type 2 Diabetes Mellitus  Initial Endocrine Consultative Visit: 08/11/2020    PATIENT IDENTIFIER: [name] is a 58 y.o. female with a past medical history of T2DM, HTN and dyslipidemia . The patient has followed with Endocrinology clinic since 08/11/2020 for consultative assistance with management of her diabetes.     DIABETIC HISTORY:  [name] was diagnosed with DM in her 36's, Metformin - diarrhea . Her hemoglobin A1c has ranged from 7.1% in 2021, peaking at 15.0% in 2020.  On her initial visit to our clinic she had an A1c of 7.7%  , she was on Jardiance, Ozempic, Pioglitazone and MDI regimen   Switch Trulicity to [facility] 10/2022   SUBJECTIVE:   During the last visit (10/20/2022): A1c 8.7 %     Today (03/07/2023): [name] is here for a follow up on diabetes management.  She has not been using dexcom  for month. Uses finger sticks 3 x a day .   Patient presented to the ED with hyperglycemia 11/23/2022 Mounjaro cost prohibitive She received a Medrol pack 12/2022 due to lower extremity pain  Continues with constipation  Denies nausea  but had a vomiting episode, no heartburn  Has gum disease    DIABETES REGIMEN:  Farxiga 10 mg, 1 tablet every morning - not taking  Mounjaro 5 mg weekly- not taking  Tresiba 80 units once daily  Pioglitazone 30 mg daily  Humalog 14 units TIDQAC CF: Humalog (BG -130/20)   Statin: no ACE-I/ARB: yes    METER DOWNLOAD SUMMARY:       DIABETIC COMPLICATIONS: Microvascular complications:   Denies: CKD, retinopathy neuropathy Last Eye Exam: Completed > 1 yr ago   Macrovascular complications:   Denies: CAD, CVA, PVD   HISTORY:  Past Medical History:  Past Medical History:  Diagnosis Date   Diabetes mellitus without complication (HCC)    Hypertension     Iron deficiency anemia, unspecified 07/10/2012   Type II or unspecified type diabetes mellitus without mention of complication, not stated as uncontrolled 07/10/2012   Past Surgical History:  Past Surgical History:  Procedure Laterality Date   BACK SURGERY     CESAREAN SECTION     CHOLECYSTECTOMY     TUBAL LIGATION     Social History:  reports that she has never smoked. She has never used smokeless tobacco. She reports current alcohol use. She reports that she does not use drugs. Family History:  Family History  Problem Relation Age of Onset   Diabetes Mother    Hypertension Father    Heart disease Father    Diabetes Father    Asthma Sister    Asthma Sister      HOME MEDICATIONS: Allergies as of 03/07/2023       Reactions   Iodine Itching   Patient not allergic to CT contrast. Had CT IV contrast on 12/07/21 (authorized by Dr. Jacqulyn Bath) Patient states she is allergic to iodine but not allergic to CT IV contrast. Patient had no reaction to the CT IV contrast on 12/07/21.    Lisinopril Nausea And Vomiting, Cough   Saxagliptin-metformin Er Diarrhea   Lansoprazole Hives, Nausea Only   Latex Rash   Sulfamethoxazole-trimethoprim Rash        Medication List        Accurate as of March 07, 2023  8:04 AM. If you have any questions, ask your nurse or doctor.          albuterol 108 (90 Base) MCG/ACT inhaler Commonly known as: VENTOLIN HFA Inhale 2 puffs into the lungs every 6 (six) hours as needed for wheezing.   amLODipine 5 MG tablet Commonly known as: NORVASC Take 1 tablet (5 mg total) by mouth daily.   aspirin EC 81 MG tablet Commonly known as: [facility]S Aspirin Low Dose Take 1 tablet (81 mg total) by mouth daily.   dapagliflozin propanediol 10 MG Tabs tablet Commonly known as: Farxiga Take 1 tablet (10 mg total) by mouth daily before breakfast.   Dexcom G6 Transmitter Misc Change every 90 days   famotidine 20 MG tablet Commonly known as: PEPCID Take 1 tablet (20  mg total) by mouth 2 (two) times daily.   insulin lispro 100 UNIT/ML KwikPen Commonly known as: HumaLOG KwikPen Max daily 30 units per scale   Insulin Pen Needle 32G X 4 MM Misc 1 Device by Does not apply route in the morning, at noon, in the evening, and at bedtime.   losartan-hydrochlorothiazide 100-25 MG tablet Commonly known as: HYZAAR Take 1 tablet by mouth daily.   methocarbamol 500 MG tablet Commonly known as: ROBAXIN Take 1 tablet (500 mg total) by mouth at bedtime as needed for muscle spasms.   pioglitazone 30 MG tablet Commonly known as: ACTOS Take 1 tablet (30 mg total) by mouth daily.   tirzepatide 5 MG/0.5ML Pen Commonly known as: MOUNJARO Inject 5 mg into the skin once a week.   Michelle Mercer FlexTouch 200 UNIT/ML FlexTouch Pen Generic drug: insulin degludec Inject 74 Units into the skin daily at 2 PM.   VITAMIN B 12 PO Take by mouth every morning.   Vitamin D-3 125 MCG (5000 UT) Tabs Take 1 tablet by mouth daily.         OBJECTIVE:   Vital Signs: There were no vitals taken for this visit.  Wt Readings from Last 3 Encounters:  10/20/22 209 lb (94.8 kg)  09/16/22 210 lb 3.2 oz (95.3 kg)  04/19/22 207 lb (93.9 kg)     Exam: General: Pt appears well and is in NAD  Lungs: Clear with good BS bilat   Heart: RRR  Extremities: Trace  pretibial edema.   Neuro: MS is good with appropriate affect, pt is alert and Ox3    DM foot exam: 03/07/2023  The skin of the feet is intact without sores or ulcerations. The pedal pulses are 2+ on right and 2+ on left. The sensation is intact to a screening 5.07, 10 gram monofilament bilaterally        DATA REVIEWED:  Lab Results  Component Value Date   HGBA1C 8.7 (A) 10/20/2022   HGBA1C 10.6 (A) 04/19/2022   HGBA1C 9.6 (H) 07/30/2021     Latest Reference Range & Units 11/23/22 09:04  Sodium 135 - 145 mmol/L 132 (L)  Potassium 3.5 - 5.1 mmol/L 4.2  Chloride 98 - 111 mmol/L 95 (L)  CO2 22 - 32 mmol/L 29   Glucose 70 - 99 mg/dL 782 (H)  BUN 6 - 20 mg/dL 25 (H)  Creatinine 9.56 - 1.00 mg/dL 2.13 (H)  Calcium 8.9 - 10.3 mg/dL 9.2  Anion gap 5 - 15  8  GFR, Estimated >60 mL/min >60  In office BG 224 Mg/DL  ASSESSMENT / PLAN / RECOMMENDATIONS:   1) Type 2 Diabetes Mellitus, Poorly controlled, With microalbuminuria  - Most recent A1c  of 11.3%. Goal A1c < 7.0 %.    -Poorly controlled diabetes -Greggory Keen has been cost prohibitive, she is working with insurance company to get better coverage -She is not on Farxiga, per patient the pharmacy needs a prescription? -I will increase Michelle Mercer as below -A new prescription for Dexcom G7 was provided as the patient has not been checking glucose at home, hence has been missing on correction opportunities  MEDICATIONS:  Mounjaro 5 mg weekly Farxiga 10 mg, 1 tablet every morning  Increase Tresiba 90 units once daily  Actos (Pioglitazone ) 30 mg daily  Continue Humalog 14 units 3 times daily before every meal Correction Scale: Humalog (BG -130/20) 3 times daily before every meal  EDUCATION / INSTRUCTIONS: BG monitoring instructions: Patient is instructed to check her blood sugars 3 times a day, before each meal . Call [facility] if: BG persistently < 70  I reviewed the Rule of 15 for the treatment of hypoglycemia in detail with the patient. Literature supplied.    2) Diabetic complications:  Eye: Does not have known diabetic retinopathy.  Neuro/ Feet: Does not have known diabetic peripheral neuropathy .  Renal: Patient does not have known baseline CKD. She   is  on an ACEI/ARB at present.     F/U in 3 months     Signed electronically by: Lyndle Herrlich, MD  [facility] Endocrinology  [facility] [address] [place] [place], [place] [address] Phone: [phone] FAX: [phone]   CC: Michelle Bass, FNP [address] [place] [address] Phone: [phone]  Fax:  [phone]  Return to Endocrinology clinic as below: No future appointments.

## 2023-03-07 NOTE — Patient Instructions (Signed)
-   Continue Farxiga 10 mg, 1 tablet every morning  - Increase Tresiba 90 units once daily  - Continue  Actos (Pioglitazone ) 30 mg daily  - Humalog 14 units with each meal  - Humalog correctional insulin: ADD extra units on insulin to your meal-time Humalog dose if your blood sugars are higher than 150. Use the scale below to help guide you:   Blood sugar before meal Number of units to inject  Less than 150 0 unit  151 -  170 1 units  171 -  190 2 units  191 -  210 3 units  211 -  230 4 units  231 -  250 5 units  251 -  270 6 units  271 -  290 7 units  291 -  310 8 units  311 - 330 9 units    HOW TO TREAT LOW BLOOD SUGARS (Blood sugar LESS THAN 70 MG/DL) Please follow the RULE OF 15 for the treatment of hypoglycemia treatment (when your (blood sugars are less than 70 mg/dL)   STEP 1: Take 15 grams of carbohydrates when your blood sugar is low, which includes:  3-4 GLUCOSE TABS  OR 3-4 OZ OF JUICE OR REGULAR SODA OR ONE TUBE OF GLUCOSE GEL    STEP 2: RECHECK blood sugar in 15 MINUTES STEP 3: If your blood sugar is still low at the 15 minute recheck --> then, go back to STEP 1 and treat AGAIN with another 15 grams of carbohydrates.

## 2023-03-07 NOTE — Addendum Note (Signed)
Addended by: Tera Partridge on: 03/07/2023 10:39 AM   Modules accepted: Orders

## 2023-03-08 ENCOUNTER — Other Ambulatory Visit (HOSPITAL_COMMUNITY): Payer: Self-pay

## 2023-03-09 ENCOUNTER — Telehealth: Payer: Self-pay

## 2023-03-09 ENCOUNTER — Other Ambulatory Visit (HOSPITAL_COMMUNITY): Payer: Self-pay

## 2023-03-09 NOTE — Telephone Encounter (Signed)
Can we do PA for [place] or see what's covered

## 2023-03-09 NOTE — Telephone Encounter (Signed)
Pharmacy Patient Advocate Encounter   Received notification from Pt Calls Messages that prior authorization for Evaristo Bury is required/requested.   Insurance verification completed.   The patient is insured through [facility] .   Per test claim: PA required; PA submitted to above mentioned insurance via CoverMyMeds Key/confirmation #/EOC [id number] Status is pending

## 2023-03-09 NOTE — Telephone Encounter (Signed)
Pharmacy Patient Advocate Encounter   Received notification from CoverMyMeds that prior authorization for [facility] is required/requested.   Insurance verification completed.   The patient is insured through [facility] .   Per test claim: PA required; PA submitted to above mentioned insurance via CoverMyMeds Key/confirmation #/EOC  [id number] Status is pending

## 2023-03-13 NOTE — Telephone Encounter (Signed)
Pharmacy Patient Advocate Encounter  Received notification from [facility] that Prior Authorization for Michelle Mercer has been DENIED.  No reason given; No denial letter received via Fax or CMM. It has been requested and will be uploaded to the media tab once received.

## 2023-03-14 MED ORDER — LANTUS SOLOSTAR 100 UNIT/ML ~~LOC~~ SOPN: 90.0000 [IU] | PEN_INJECTOR | Freq: Every day | SUBCUTANEOUS | 11 refills | Status: DC

## 2023-03-14 NOTE — Addendum Note (Signed)
Addended by: Scarlette Shorts on: 03/14/2023 11:03 AM   Modules accepted: Orders

## 2023-03-21 NOTE — Telephone Encounter (Signed)
Pharmacy Patient Advocate Encounter  Received notification from [facility] that Prior Authorization for Michelle Mercer has been APPROVED through 03/08/2024   PA #/Case ID/Reference #: [id number]

## 2023-03-23 NOTE — Telephone Encounter (Addendum)
 Denial letter received and indexed.

## 2023-03-30 ENCOUNTER — Telehealth: Payer: Self-pay | Admitting: *Deleted

## 2023-03-30 NOTE — Telephone Encounter (Signed)
Patient was identified as falling into the [place] Measure - Diabetes.   Patient was: Left voicemail to schedule with primary care provider.

## 2023-06-14 ENCOUNTER — Encounter: Payer: Self-pay | Admitting: Internal Medicine

## 2023-06-14 ENCOUNTER — Telehealth: Payer: Self-pay

## 2023-06-14 ENCOUNTER — Ambulatory Visit: Payer: Self-pay | Admitting: Internal Medicine

## 2023-06-14 ENCOUNTER — Other Ambulatory Visit (HOSPITAL_BASED_OUTPATIENT_CLINIC_OR_DEPARTMENT_OTHER): Payer: Self-pay

## 2023-06-14 ENCOUNTER — Other Ambulatory Visit (HOSPITAL_COMMUNITY): Payer: Self-pay

## 2023-06-14 VITALS — BP 122/70 | HR 70 | Ht 61.0 in | Wt 220.0 lb

## 2023-06-14 DIAGNOSIS — E785 Hyperlipidemia, unspecified: Secondary | ICD-10-CM | POA: Diagnosis not present

## 2023-06-14 DIAGNOSIS — E1165 Type 2 diabetes mellitus with hyperglycemia: Secondary | ICD-10-CM

## 2023-06-14 DIAGNOSIS — Z794 Long term (current) use of insulin: Secondary | ICD-10-CM | POA: Diagnosis not present

## 2023-06-14 DIAGNOSIS — R809 Proteinuria, unspecified: Secondary | ICD-10-CM

## 2023-06-14 DIAGNOSIS — E1129 Type 2 diabetes mellitus with other diabetic kidney complication: Secondary | ICD-10-CM | POA: Diagnosis not present

## 2023-06-14 LAB — POCT GLUCOSE (DEVICE FOR HOME USE): Glucose Fasting, POC: 139 mg/dL — AB (ref 70–99)

## 2023-06-14 LAB — POCT GLYCOSYLATED HEMOGLOBIN (HGB A1C): Hemoglobin A1C: 11.7 % — AB (ref 4.0–5.6)

## 2023-06-14 MED ORDER — PIOGLITAZONE HCL 45 MG PO TABS: 45.0000 mg | ORAL_TABLET | Freq: Every day | ORAL | 3 refills | Status: DC

## 2023-06-14 MED ORDER — MOUNJARO 5 MG/0.5ML ~~LOC~~ SOAJ
5.0000 mg | SUBCUTANEOUS | 3 refills | Status: DC
  Filled 2023-06-14 – 2023-07-24 (×2): qty 2, 28d supply, fill #0
  Filled 2023-08-21: qty 2, 28d supply, fill #1

## 2023-06-14 MED ORDER — DAPAGLIFLOZIN PROPANEDIOL 10 MG PO TABS: 10.0000 mg | ORAL_TABLET | Freq: Every day | ORAL | 3 refills | Status: DC

## 2023-06-14 NOTE — Telephone Encounter (Signed)
 Pharmacy Patient Advocate Encounter  Received notification from [facility] that Prior Authorization for Mounjaro  has been APPROVED through 06/13/2024

## 2023-06-14 NOTE — Telephone Encounter (Signed)
 Pharmacy Patient Advocate Encounter   Received notification from CoverMyMeds that prior authorization for Mounjaro  is required/requested.   Insurance verification completed.   The patient is insured through [facility] .   Per test claim: PA required; PA submitted to above mentioned insurance via CoverMyMeds Key/confirmation #/EOC [id number] Status is pending

## 2023-06-14 NOTE — Patient Instructions (Addendum)
-   Continue Farxiga  10 mg, 1 tablet every morning  - Lantus  90 units once daily  - Increase  Actos  (Pioglitazone  ) 45 mg daily  - Humalog  14 units with each meal  - Take Humalog  6 units with a snack  - Humalog  correctional insulin : ADD extra units on insulin  to your meal-time Humalog  dose if your blood sugars are higher than 150. Use the scale below to help guide you:   Blood sugar before meal Number of units to inject  Less than 150 0 unit  151 -  170 1 units  171 -  190 2 units  191 -  210 3 units  211 -  230 4 units  231 -  250 5 units  251 -  270 6 units  271 -  290 7 units  291 -  310 8 units  311 - 330 9 units    HOW TO TREAT LOW BLOOD SUGARS (Blood sugar LESS THAN 70 MG/DL) Please follow the RULE OF 15 for the treatment of hypoglycemia treatment (when your (blood sugars are less than 70 mg/dL)   STEP 1: Take 15 grams of carbohydrates when your blood sugar is low, which includes:  3-4 GLUCOSE TABS  OR 3-4 OZ OF JUICE OR REGULAR SODA OR ONE TUBE OF GLUCOSE GEL    STEP 2: RECHECK blood sugar in 15 MINUTES STEP 3: If your blood sugar is still low at the 15 minute recheck --> then, go back to STEP 1 and treat AGAIN with another 15 grams of carbohydrates.

## 2023-06-14 NOTE — Progress Notes (Addendum)
 Name: Michelle Mercer  Age/ Sex: 58 y.o., female   MRN/ DOB: [id number], 1966/02/06     PCP: Adra Alanis, FNP   Reason for Endocrinology Evaluation: Type 2 Diabetes Mellitus  Initial Endocrine Consultative Visit: 08/11/2020    PATIENT IDENTIFIER: [name] is a 58 y.o. female with a past medical history of T2DM, HTN and dyslipidemia . The patient has followed with Endocrinology clinic since 08/11/2020 for consultative assistance with management of her diabetes.     DIABETIC HISTORY:  Ms. Cann was diagnosed with DM in her 81's, Metformin - diarrhea . Her hemoglobin A1c has ranged from 7.1% in 2021, peaking at 15.0% in 2020.  On her initial visit to our clinic she had an A1c of 7.7%  , she was on Jardiance , Ozempic , Pioglitazone  and MDI regimen   Switch Trulicity  to Mounjaro  10/2022   SUBJECTIVE:   During the last visit (03/07/2023): A1c 11.3 %     Today (06/14/2023): [name] is here for a follow up on diabetes management.  She has not been able to use the Dexcom nor the Mounjaro  due to cost. She checks glucose 2-3x daily   She completed physical therapy for chronic low back pain through [facility] Continues with constipation  Has occasional nausea but no vomiting  Has gum disease, has been following with her dentist    DIABETES REGIMEN:  Farxiga  10 mg, 1 tablet every morning  Mounjaro  5 mg weekly- not taking  Lantus  90 units once daily  Pioglitazone  30 mg daily  Humalog  14 units TIDQAC CF: Humalog  (BG -130/20)   Statin: no ACE-I/ARB: yes    METER DOWNLOAD SUMMARY:       DIABETIC COMPLICATIONS: Microvascular complications:   Denies: CKD, retinopathy neuropathy Last Eye Exam: Completed > 1 yr ago   Macrovascular complications:   Denies: CAD, CVA, PVD   HISTORY:  Past Medical History:  Past Medical History:  Diagnosis Date   Diabetes mellitus without complication (HCC)    Hypertension    Iron deficiency anemia,  unspecified 07/10/2012   Type II or unspecified type diabetes mellitus without mention of complication, not stated as uncontrolled 07/10/2012   Past Surgical History:  Past Surgical History:  Procedure Laterality Date   BACK SURGERY     CESAREAN SECTION     CHOLECYSTECTOMY     TUBAL LIGATION     Social History:  reports that she has never smoked. She has never used smokeless tobacco. She reports current alcohol use. She reports that she does not use drugs. Family History:  Family History  Problem Relation Age of Onset   Diabetes Mother    Hypertension Father    Heart disease Father    Diabetes Father    Asthma Sister    Asthma Sister      HOME MEDICATIONS: Allergies as of 06/14/2023       Reactions   Iodine Itching   Patient not allergic to CT contrast. Had CT IV contrast on 12/07/21 (authorized by Dr. Dolan Freiberg) Patient states she is allergic to iodine but not allergic to CT IV contrast. Patient had no reaction to the CT IV contrast on 12/07/21.    Lisinopril Nausea And Vomiting, Cough   Saxagliptin-metformin Er Diarrhea   Lansoprazole Hives, Nausea Only   Latex Rash   Sulfamethoxazole-trimethoprim Rash        Medication List        Accurate as of June 14, 2023  8:22 AM. If you have any questions, ask  your nurse or doctor.          albuterol  108 (90 Base) MCG/ACT inhaler Commonly known as: VENTOLIN  HFA Inhale 2 puffs into the lungs every 6 (six) hours as needed for wheezing.   amLODipine  5 MG tablet Commonly known as: NORVASC  Take 1 tablet (5 mg total) by mouth daily.   aspirin  EC 81 MG tablet Commonly known as: [facility]S Aspirin  Low Dose Take 1 tablet (81 mg total) by mouth daily.   dapagliflozin  propanediol 10 MG Tabs tablet Commonly known as: Farxiga  Take 1 tablet (10 mg total) by mouth daily before breakfast.   Dexcom G7 Sensor Misc 1 Device by Does not apply route as directed.   famotidine  20 MG tablet Commonly known as: PEPCID  Take 1 tablet (20 mg  total) by mouth 2 (two) times daily.   insulin  lispro 100 UNIT/ML KwikPen Commonly known as: HumaLOG  KwikPen Max daily 60 units per scale   Insulin  Pen Needle 32G X 4 MM Misc 1 Device by Does not apply route in the morning, at noon, in the evening, and at bedtime.   Lantus  SoloStar 100 UNIT/ML Solostar Pen Generic drug: insulin  glargine Inject 90 Units into the skin daily.   losartan -hydrochlorothiazide 100-25 MG tablet Commonly known as: HYZAAR Take 1 tablet by mouth daily.   methocarbamol  500 MG tablet Commonly known as: ROBAXIN  Take 1 tablet (500 mg total) by mouth at bedtime as needed for muscle spasms.   Mounjaro  5 MG/0.5ML Pen Generic drug: tirzepatide  INJECT 5 MG SUBCUTANEOUSLY WEEKLY   pioglitazone  30 MG tablet Commonly known as: ACTOS  Take 1 tablet (30 mg total) by mouth daily.   Tresiba  FlexTouch 200 UNIT/ML FlexTouch Pen Generic drug: insulin  degludec Inject 90 Units into the skin daily at 2 PM.   VITAMIN B 12 PO Take by mouth every morning.   Vitamin D-3 125 MCG (5000 UT) Tabs Take 1 tablet by mouth daily.         OBJECTIVE:   Vital Signs: BP 122/70 (BP Location: Left Arm, Patient Position: Sitting, Cuff Size: Normal)   Pulse 70   Ht 5\' 1"  (1.549 m)   Wt 220 lb (99.8 kg)   SpO2 98%   BMI 41.57 kg/m   Wt Readings from Last 3 Encounters:  06/14/23 220 lb (99.8 kg)  10/20/22 209 lb (94.8 kg)  09/16/22 210 lb 3.2 oz (95.3 kg)     Exam: General: Pt appears well and is in NAD  Lungs: Clear with good BS bilat   Heart: RRR  Extremities: Trace  pretibial edema.   Neuro: MS is good with appropriate affect, pt is alert and Ox3    DM foot exam: 03/07/2023  The skin of the feet is intact without sores or ulcerations. The pedal pulses are 2+ on right and 2+ on left. The sensation is intact to a screening 5.07, 10 gram monofilament bilaterally        DATA REVIEWED:  Lab Results  Component Value Date   HGBA1C 11.3 (A) 03/07/2023   HGBA1C  8.7 (A) 10/20/2022   HGBA1C 10.6 (A) 04/19/2022    Latest Reference Range & Units 06/14/23 08:42  Sodium 135 - 146 mmol/L 140  Potassium 3.5 - 5.3 mmol/L 4.8  Chloride 98 - 110 mmol/L 103  CO2 20 - 32 mmol/L 30  Glucose 65 - 99 mg/dL 782 (H)  BUN 7 - 25 mg/dL 13  Creatinine 9.56 - 2.13 mg/dL 0.86  Calcium 8.6 - 57.8 mg/dL 9.4  BUN/Creatinine Ratio 6 -  22 (calc) SEE NOTE:  Total CHOL/HDL Ratio <5.0 (calc) 3.7  Cholesterol <200 mg/dL 161  HDL Cholesterol > OR = 50 mg/dL 35 (L)  LDL Cholesterol (Calc) mg/dL (calc) 82  Non-HDL Cholesterol (Calc) <130 mg/dL (calc) 95  Triglycerides <150 mg/dL 58    In office BG 096 Mg/DL  ASSESSMENT / PLAN / RECOMMENDATIONS:   1) Type 2 Diabetes Mellitus, Poorly controlled, With microalbuminuria  - Most recent A1c of 11.7%. Goal A1c < 7.0 %.    -Patient continues with poorly controlled diabetes -Patient with social determinants, Dexcom and Mounjaro  have been cost prohibitive -No glucose data today, in office BG optimal at 138 mg/DL fasting, no change to Lantus  -I suspect that she is not consistently taking Humalog  or that she would snack without Humalog  at times, hence an A1c of 11.7% and a fasting that is optimal -She will be provided with Humalog  dose to take with snacks - We discussed sending the prescription of Mounjaro  to med center in [facility] to see if the coupon would work better, as her local pharmacy instead of pricing the Mounjaro  for $25 the only took $25 of the total price - I will also increase pioglitazone    MEDICATIONS: Take Mounjaro  5 mg weekly Farxiga  10 mg, 1 tablet every morning  Continue Tresiba  90 units once daily  Increase Actos  (Pioglitazone  ) 45 mg daily  Continue Humalog  14 units 3 times daily before every meal Take Humalog  6 units with a snack Correction Scale: Humalog  (BG -130/20) 3 times daily before every meal  EDUCATION / INSTRUCTIONS: BG monitoring instructions: Patient is instructed to check her blood sugars  3 times a day, before each meal . Call [place] [facility] if: BG persistently < 70  I reviewed the Rule of 15 for the treatment of hypoglycemia in detail with the patient. Literature supplied.    2) Diabetic complications:  Eye: Does not have known diabetic retinopathy.  Neuro/ Feet: Does not have known diabetic peripheral neuropathy .  Renal: Patient does not have known baseline CKD. She   is  on an ACEI/ARB at present.   3) Dyslipidemia:  - Lipid panel acceptable - HDL is low, will encourage exercise - During the office visit patient indicated she is on cholesterol medication, I am unable to see this on her medication list, will continue to monitor and we will re-verify  4) Microalbuminuria  - The patient is already on losartan  and Farxiga  - Will encourage glucose control  F/U in 3 months     Signed electronically by: Natale Bail, MD  [facility] Endocrinology  [facility] [address] [place] [place], [place] [address] Phone: [phone] FAX: [phone]   CC: Adra Alanis, FNP [address] [place] [address] Phone: [phone]  Fax: [phone]  Return to Endocrinology clinic as below: No future appointments.

## 2023-06-15 ENCOUNTER — Encounter: Payer: Self-pay | Admitting: Internal Medicine

## 2023-06-15 ENCOUNTER — Other Ambulatory Visit (HOSPITAL_BASED_OUTPATIENT_CLINIC_OR_DEPARTMENT_OTHER): Payer: Self-pay

## 2023-06-15 LAB — BASIC METABOLIC PANEL WITHOUT GFR
BUN: 13 mg/dL (ref 7–25)
CO2: 30 mmol/L (ref 20–32)
Calcium: 9.4 mg/dL (ref 8.6–10.4)
Chloride: 103 mmol/L (ref 98–110)
Creat: 0.93 mg/dL (ref 0.50–1.03)
Glucose, Bld: 118 mg/dL — ABNORMAL HIGH (ref 65–99)
Potassium: 4.8 mmol/L (ref 3.5–5.3)
Sodium: 140 mmol/L (ref 135–146)

## 2023-06-15 LAB — LIPID PANEL
Cholesterol: 130 mg/dL (ref <200)
HDL: 35 mg/dL — ABNORMAL LOW (ref >=50)
LDL Cholesterol (Calc): 82 mg/dL
Non-HDL Cholesterol (Calc): 95 mg/dL (ref <130)
Total CHOL/HDL Ratio: 3.7 (calc) (ref <5.0)
Triglycerides: 58 mg/dL (ref <150)

## 2023-06-15 LAB — MICROALBUMIN / CREATININE URINE RATIO
Creatinine, Urine: 59 mg/dL (ref 20–275)
Microalb Creat Ratio: 419 mg/g{creat} — ABNORMAL HIGH (ref <30)
Microalb, Ur: 24.7 mg/dL

## 2023-06-28 ENCOUNTER — Other Ambulatory Visit: Payer: Self-pay | Admitting: Family

## 2023-06-29 ENCOUNTER — Encounter: Payer: Self-pay | Admitting: Family

## 2023-07-11 ENCOUNTER — Encounter: Payer: Self-pay | Admitting: Family

## 2023-07-24 ENCOUNTER — Other Ambulatory Visit (HOSPITAL_BASED_OUTPATIENT_CLINIC_OR_DEPARTMENT_OTHER): Payer: Self-pay

## 2023-08-21 ENCOUNTER — Other Ambulatory Visit (HOSPITAL_BASED_OUTPATIENT_CLINIC_OR_DEPARTMENT_OTHER): Payer: Self-pay

## 2023-09-12 ENCOUNTER — Encounter: Payer: Self-pay | Admitting: Internal Medicine

## 2023-09-12 ENCOUNTER — Encounter: Admitting: Family

## 2023-09-12 ENCOUNTER — Ambulatory Visit: Admitting: Internal Medicine

## 2023-09-12 ENCOUNTER — Other Ambulatory Visit (HOSPITAL_BASED_OUTPATIENT_CLINIC_OR_DEPARTMENT_OTHER): Payer: Self-pay

## 2023-09-12 VITALS — BP 128/76 | HR 71 | Ht 61.0 in | Wt 213.0 lb

## 2023-09-12 DIAGNOSIS — E1165 Type 2 diabetes mellitus with hyperglycemia: Secondary | ICD-10-CM

## 2023-09-12 DIAGNOSIS — Z794 Long term (current) use of insulin: Secondary | ICD-10-CM | POA: Diagnosis not present

## 2023-09-12 DIAGNOSIS — E785 Hyperlipidemia, unspecified: Secondary | ICD-10-CM | POA: Diagnosis not present

## 2023-09-12 DIAGNOSIS — R809 Proteinuria, unspecified: Secondary | ICD-10-CM | POA: Diagnosis not present

## 2023-09-12 DIAGNOSIS — E1129 Type 2 diabetes mellitus with other diabetic kidney complication: Secondary | ICD-10-CM

## 2023-09-12 LAB — POCT GLYCOSYLATED HEMOGLOBIN (HGB A1C): Hemoglobin A1C: 9.3 % — AB (ref 4.0–5.6)

## 2023-09-12 LAB — POCT GLUCOSE (DEVICE FOR HOME USE): POC Glucose: 185 mg/dL — AB (ref 70–99)

## 2023-09-12 MED ORDER — TIRZEPATIDE 7.5 MG/0.5ML ~~LOC~~ SOAJ
7.5000 mg | SUBCUTANEOUS | 3 refills | Status: DC
Start: 2023-09-12 — End: 2023-12-19
  Filled 2023-09-12 – 2023-09-15 (×2): qty 2, 28d supply, fill #0
  Filled 2023-10-20 (×2): qty 2, 28d supply, fill #1
  Filled 2023-11-17: qty 2, 28d supply, fill #2

## 2023-09-12 MED ORDER — DAPAGLIFLOZIN PROPANEDIOL 10 MG PO TABS: 10.0000 mg | ORAL_TABLET | Freq: Every day | ORAL | 3 refills | Status: AC

## 2023-09-12 MED ORDER — PIOGLITAZONE HCL 45 MG PO TABS: 45.0000 mg | ORAL_TABLET | Freq: Every day | ORAL | 3 refills | Status: AC

## 2023-09-12 MED ORDER — ACCU-CHEK GUIDE TEST VI STRP
1.0000 | ORAL_STRIP | Freq: Three times a day (TID) | 12 refills | Status: AC
Start: 2023-09-12 — End: ?
  Filled 2023-09-12: qty 100, 30d supply, fill #0

## 2023-09-12 MED ORDER — INSULIN PEN NEEDLE 32G X 4 MM MISC: 1.0000 | Freq: Four times a day (QID) | 3 refills | Status: AC

## 2023-09-12 MED ORDER — INSULIN LISPRO (1 UNIT DIAL) 100 UNIT/ML (KWIKPEN): PEN_INJECTOR | SUBCUTANEOUS | 2 refills | Status: AC

## 2023-09-12 MED ORDER — LANTUS SOLOSTAR 100 UNIT/ML ~~LOC~~ SOPN: 90.0000 [IU] | PEN_INJECTOR | Freq: Every day | SUBCUTANEOUS | 11 refills | Status: DC

## 2023-09-12 MED ORDER — ACCU-CHEK GUIDE W/DEVICE KIT
1.0000 | PACK | Freq: Every day | 0 refills | Status: AC
  Filled 2023-09-12: qty 1, 30d supply, fill #0

## 2023-09-12 MED ORDER — DEXCOM G7 SENSOR MISC
1.0000 | 11 refills | Status: AC
  Filled 2023-09-12: qty 3, 30d supply, fill #0
  Filled 2023-10-20: qty 3, 30d supply, fill #1
  Filled 2023-11-17: qty 1, 10d supply, fill #2

## 2023-09-12 NOTE — Patient Instructions (Signed)
-   Farxiga  10 mg, 1 tablet every morning  - Pioglitazone  45 mg weekly - Increase Mounjaro  7.5 mg weekly  - Lantus  90 units once daily  - Humalog  14 units with each meal  - Take Humalog  6 units with a snack  - Humalog  correctional insulin : ADD extra units on insulin  to your meal-time Humalog  dose if your blood sugars are higher than 150. Use the scale below to help guide you:   Blood sugar before meal Number of units to inject  Less than 150 0 unit  151 -  170 1 units  171 -  190 2 units  191 -  210 3 units  211 -  230 4 units  231 -  250 5 units  251 -  270 6 units  271 -  290 7 units  291 -  310 8 units  311 - 330 9 units    HOW TO TREAT LOW BLOOD SUGARS (Blood sugar LESS THAN 70 MG/DL) Please follow the RULE OF 15 for the treatment of hypoglycemia treatment (when your (blood sugars are less than 70 mg/dL)   STEP 1: Take 15 grams of carbohydrates when your blood sugar is low, which includes:  3-4 GLUCOSE TABS  OR 3-4 OZ OF JUICE OR REGULAR SODA OR ONE TUBE OF GLUCOSE GEL    STEP 2: RECHECK blood sugar in 15 MINUTES STEP 3: If your blood sugar is still low at the 15 minute recheck --> then, go back to STEP 1 and treat AGAIN with another 15 grams of carbohydrates.

## 2023-09-12 NOTE — Progress Notes (Signed)
 Name: Michelle Mercer  Age/ Sex: 58 y.o., female   MRN/ DOB: [id number], 1966-02-11     PCP: Jason Leita Repine, FNP   Reason for Endocrinology Evaluation: Type 2 Diabetes Mellitus  Initial Endocrine Consultative Visit: 08/11/2020    PATIENT IDENTIFIER: Michelle Mercer is a 58 y.o. female with a past medical history of T2DM, HTN and dyslipidemia . The patient has followed with Endocrinology clinic since 08/11/2020 for consultative assistance with management of her diabetes.     DIABETIC HISTORY:  Michelle Mercer was diagnosed with DM in her 34's, Metformin - diarrhea . Her hemoglobin A1c has ranged from 7.1% in 2021, peaking at 15.0% in 2020.  On her initial visit to our clinic she had an A1c of 7.7%  , she was on Jardiance , Ozempic , Pioglitazone  and MDI regimen   Switch Trulicity  to Mounjaro  10/2022   SUBJECTIVE:   During the last visit (06/14/2023): A1c 11.7 %     Today (09/12/2023): Michelle Mercer is here for a follow up on diabetes management.  She has not been checking glucose due to machine running out of battery.    She is not sure if she is on pioglitazone  or farxiga   No nausea or vomiting  Continues with occasional constipation  She follows with dentisty for gum disease  Has left ankle sprain , was wearing a boot   DIABETES REGIMEN:  Farxiga  10 mg, 1 tablet every morning  Pioglitazone  45 mg daily Mounjaro  5 mg weekly Lantus  90 units once daily  Humalog  14 units TIDQAC Humalog  6 units with a snack CF: Humalog  (BG -130/20)     Statin: no ACE-I/ARB: yes    METER DOWNLOAD SUMMARY:  N/a     DIABETIC COMPLICATIONS: Microvascular complications:   Denies: CKD, retinopathy neuropathy Last Eye Exam: Completed > 1 yr ago   Macrovascular complications:   Denies: CAD, CVA, PVD   HISTORY:  Past Medical History:  Past Medical History:  Diagnosis Date   Diabetes mellitus without complication (HCC)    Hypertension    Iron deficiency anemia, unspecified  07/10/2012   Type II or unspecified type diabetes mellitus without mention of complication, not stated as uncontrolled 07/10/2012   Past Surgical History:  Past Surgical History:  Procedure Laterality Date   BACK SURGERY     CESAREAN SECTION     CHOLECYSTECTOMY     TUBAL LIGATION     Social History:  reports that she has never smoked. She has never used smokeless tobacco. She reports current alcohol use. She reports that she does not use drugs. Family History:  Family History  Problem Relation Age of Onset   Diabetes Mother    Hypertension Father    Heart disease Father    Diabetes Father    Asthma Sister    Asthma Sister      HOME MEDICATIONS: Allergies as of 09/12/2023       Reactions   Iodine Itching   Patient not allergic to CT contrast. Had CT IV contrast on 12/07/21 (authorized by Dr. Darra) Patient states she is allergic to iodine but not allergic to CT IV contrast. Patient had no reaction to the CT IV contrast on 12/07/21.    Lisinopril Nausea And Vomiting, Cough   Saxagliptin-metformin Er Diarrhea   Lansoprazole Hives, Nausea Only   Latex Rash   Sulfamethoxazole-trimethoprim Rash        Medication List        Accurate as of September 12, 2023 11:23 AM. If you have  any questions, ask your nurse or doctor.          albuterol  108 (90 Base) MCG/ACT inhaler Commonly known as: VENTOLIN  HFA Inhale 2 puffs into the lungs every 6 (six) hours as needed for wheezing.   amLODipine  5 MG tablet Commonly known as: NORVASC  Take 1 tablet (5 mg total) by mouth daily.   aspirin  EC 81 MG tablet Commonly known as: CVS Aspirin  Low Dose Take 1 tablet (81 mg total) by mouth daily.   dapagliflozin  propanediol 10 MG Tabs tablet Commonly known as: Farxiga  Take 1 tablet (10 mg total) by mouth daily before breakfast.   famotidine  20 MG tablet Commonly known as: PEPCID  Take 1 tablet (20 mg total) by mouth 2 (two) times daily.   insulin  lispro 100 UNIT/ML KwikPen Commonly known  as: HumaLOG  KwikPen Max daily 60 units per scale   Insulin  Pen Needle 32G X 4 MM Misc 1 Device by Does not apply route in the morning, at noon, in the evening, and at bedtime.   Lantus  SoloStar 100 UNIT/ML Solostar Pen Generic drug: insulin  glargine Inject 90 Units into the skin daily.   losartan -hydrochlorothiazide 100-25 MG tablet Commonly known as: HYZAAR Take 1 tablet by mouth daily.   methocarbamol  500 MG tablet Commonly known as: ROBAXIN  TAKE 1 TABLET (500 MG TOTAL) BY MOUTH EVERY DAY AT BEDTIME AS NEEDED FOR MUSCLE SPASM   Mounjaro  5 MG/0.5ML Pen Generic drug: tirzepatide  Inject 5 mg into the skin once a week.   pioglitazone  45 MG tablet Commonly known as: ACTOS  Take 1 tablet (45 mg total) by mouth daily.   VITAMIN B 12 PO Take by mouth every morning.   Vitamin D-3 125 MCG (5000 UT) Tabs Take 1 tablet by mouth daily.         OBJECTIVE:   Vital Signs: BP 128/76 (BP Location: Left Arm, Patient Position: Sitting, Cuff Size: Normal)   Pulse 71   Ht 5' 1 (1.549 m)   Wt 213 lb (96.6 kg)   SpO2 99%   BMI 40.25 kg/m   Wt Readings from Last 3 Encounters:  09/12/23 213 lb (96.6 kg)  06/14/23 220 lb (99.8 kg)  10/20/22 209 lb (94.8 kg)     Exam: General: Pt appears well and is in NAD  Lungs: Clear with good BS bilat   Heart: RRR  Extremities: Trace  pretibial edema.   Neuro: MS is good with appropriate affect, pt is alert and Ox3    DM foot exam: 03/07/2023  The skin of the feet is intact without sores or ulcerations. The pedal pulses are 2+ on right and 2+ on left. The sensation is intact to a screening 5.07, 10 gram monofilament bilaterally        DATA REVIEWED:  Lab Results  Component Value Date   HGBA1C 9.3 (A) 09/12/2023   HGBA1C 11.7 (A) 06/14/2023   HGBA1C 11.3 (A) 03/07/2023    Latest Reference Range & Units 06/14/23 08:42  Sodium 135 - 146 mmol/L 140  Potassium 3.5 - 5.3 mmol/L 4.8  Chloride 98 - 110 mmol/L 103  CO2 20 - 32  mmol/L 30  Glucose 65 - 99 mg/dL 881 (H)  BUN 7 - 25 mg/dL 13  Creatinine 9.49 - 8.96 mg/dL 9.06  Calcium 8.6 - 89.5 mg/dL 9.4  BUN/Creatinine Ratio 6 - 22 (calc) SEE NOTE:  Total CHOL/HDL Ratio <5.0 (calc) 3.7  Cholesterol <200 mg/dL 869  HDL Cholesterol > OR = 50 mg/dL 35 (L)  LDL Cholesterol (  Calc) mg/dL (calc) 82  Non-HDL Cholesterol (Calc) <130 mg/dL (calc) 95  Triglycerides <150 mg/dL 58    In office BG 861 Mg/DL  ASSESSMENT / PLAN / RECOMMENDATIONS:   1) Type 2 Diabetes Mellitus, Poorly controlled, With microalbuminuria  - Most recent A1c of 9.3%. Goal A1c < 7.0 %.    - A1c has trended down from 11.7% to 9.3% -Patient was not sure if she is on the Farxiga  or the pioglitazone , I have recommended a pillbox -Dexcom has been cost prohibitive, I did send a new prescription to the med center in [facility] to see if this would be priced better. - She does skip Humalog  during work hours - Will increase Mounjaro  - Unable to make changes to her insulin  regimen due to lack of glucose data  MEDICATIONS: Increase Mounjaro  7.5 mg weekly Farxiga  10 mg, 1 tablet every morning  Pioglitazone  45 mg daily Lantus  90 units once daily   Humalog  14 units 3 times daily before every meal Correction Scale: Humalog  (BG -130/20) 3 times daily before every meal  EDUCATION / INSTRUCTIONS: BG monitoring instructions: Patient is instructed to check her blood sugars 3 times a day, before each meal . Call [place] [facility] if: BG persistently < 70  I reviewed the Rule of 15 for the treatment of hypoglycemia in detail with the patient. Literature supplied.    2) Diabetic complications:  Eye: Does not have known diabetic retinopathy.  Neuro/ Feet: Does not have known diabetic peripheral neuropathy .  Renal: Patient does not have known baseline CKD. She   is  on an ACEI/ARB at present.   3) Dyslipidemia:  - Lipid panel acceptable  4) Microalbuminuria  - The patient is already on  losartan  and Farxiga  - Will encourage glucose control  F/U in 3 months     Signed electronically by: Stefano Redgie Butts, MD  [facility]  [facility] [address] [place] [place], [place] [address] Phone: [phone] FAX: [phone]   CC: Jason Leita Repine, FNP [address] [place] [address] Phone: [phone]  Fax: [phone]  Return to Endocrinology clinic as below: Future Appointments  Date Time Provider Department Center  09/29/2023  1:20 PM Jason Leita Repine, FNP [id number]

## 2023-09-13 ENCOUNTER — Ambulatory Visit: Admitting: Internal Medicine

## 2023-09-15 ENCOUNTER — Other Ambulatory Visit (HOSPITAL_BASED_OUTPATIENT_CLINIC_OR_DEPARTMENT_OTHER): Payer: Self-pay

## 2023-09-21 ENCOUNTER — Other Ambulatory Visit (HOSPITAL_BASED_OUTPATIENT_CLINIC_OR_DEPARTMENT_OTHER): Payer: Self-pay

## 2023-09-22 ENCOUNTER — Encounter: Payer: Self-pay | Admitting: Internal Medicine

## 2023-09-29 ENCOUNTER — Other Ambulatory Visit: Payer: Self-pay | Admitting: Family

## 2023-09-29 ENCOUNTER — Encounter: Payer: Self-pay | Admitting: Family

## 2023-09-29 ENCOUNTER — Ambulatory Visit (INDEPENDENT_AMBULATORY_CARE_PROVIDER_SITE_OTHER): Admitting: Family

## 2023-09-29 VITALS — BP 122/64 | HR 81 | Temp 98.8°F | Ht 61.0 in | Wt 212.8 lb

## 2023-09-29 DIAGNOSIS — Z1231 Encounter for screening mammogram for malignant neoplasm of breast: Secondary | ICD-10-CM | POA: Diagnosis not present

## 2023-09-29 DIAGNOSIS — Z13220 Encounter for screening for lipoid disorders: Secondary | ICD-10-CM | POA: Diagnosis not present

## 2023-09-29 DIAGNOSIS — Z1211 Encounter for screening for malignant neoplasm of colon: Secondary | ICD-10-CM | POA: Diagnosis not present

## 2023-09-29 DIAGNOSIS — E1169 Type 2 diabetes mellitus with other specified complication: Secondary | ICD-10-CM

## 2023-09-29 DIAGNOSIS — Z0000 Encounter for general adult medical examination without abnormal findings: Secondary | ICD-10-CM | POA: Diagnosis not present

## 2023-09-29 DIAGNOSIS — Z23 Encounter for immunization: Secondary | ICD-10-CM | POA: Diagnosis not present

## 2023-09-29 MED ORDER — LOSARTAN POTASSIUM-HCTZ 100-25 MG PO TABS: 1.0000 | ORAL_TABLET | Freq: Every day | ORAL | 1 refills | Status: AC

## 2023-09-29 MED ORDER — AMLODIPINE BESYLATE 5 MG PO TABS: 5.0000 mg | ORAL_TABLET | Freq: Every day | ORAL | 1 refills | Status: AC

## 2023-09-29 NOTE — Addendum Note (Signed)
 Addended by: Yvonne Petite M on: 09/29/2023 03:11 PM   Modules accepted: Orders

## 2023-09-29 NOTE — Progress Notes (Signed)
 Michelle Mercer is a 58 y.o. female with the following history as recorded in [employer]:  Patient Active Problem List   Diagnosis Date Noted   Type 2 diabetes mellitus with diabetic microalbuminuria, with long-term current use of insulin  (HCC) 03/07/2023   Long-term (current) use of injectable non-insulin  antidiabetic drugs 10/20/2022   Long term (current) use of oral hypoglycemic drugs 10/20/2022   Type 2 diabetes mellitus with hyperglycemia, with long-term current use of insulin  (HCC) 04/19/2022   Iron deficiency anemia, unspecified 07/10/2012   Type II or unspecified type diabetes mellitus without mention of complication, not stated as uncontrolled 07/10/2012    Current Outpatient Medications  Medication Sig Dispense Refill   aspirin  EC ([facility] ASPIRIN  LOW DOSE) 81 MG tablet Take 1 tablet (81 mg total) by mouth daily. 90 tablet 0   Blood Glucose Monitoring Suppl (ACCU-CHEK GUIDE) w/Device KIT Use as instructed 3 (three) times daily. 1 kit 0   Cholecalciferol (VITAMIN D-3) 125 MCG (5000 UT) TABS Take 1 tablet by mouth daily.     Continuous Glucose Sensor (DEXCOM G7 SENSOR) MISC USe as directed and change every 10 days. 3 each 11   Cyanocobalamin (VITAMIN B 12 PO) Take by mouth every morning.     dapagliflozin  propanediol (FARXIGA ) 10 MG TABS tablet Take 1 tablet (10 mg total) by mouth daily before breakfast. 90 tablet 3   glucose blood (ACCU-CHEK GUIDE TEST) test strip Use as instructed 3 (three) times daily. 300 each 12   insulin  glargine (LANTUS  SOLOSTAR) 100 UNIT/ML Solostar Pen Inject 90 Units into the skin daily. 90 mL 11   insulin  lispro (HUMALOG  KWIKPEN) 100 UNIT/ML KwikPen Max daily 60 units per scale 60 mL 2   Insulin  Pen Needle 32G X 4 MM MISC 1 Device by Does not apply route in the morning, at noon, in the evening, and at bedtime. 400 each 3   methocarbamol  (ROBAXIN ) 500 MG tablet TAKE 1 TABLET (500 MG TOTAL) BY MOUTH EVERY DAY AT BEDTIME AS NEEDED FOR MUSCLE SPASM 30 tablet 0    pioglitazone  (ACTOS ) 45 MG tablet Take 1 tablet (45 mg total) by mouth daily. 90 tablet 3   tirzepatide  (MOUNJARO ) 7.5 MG/0.5ML Pen Inject 7.5 mg into the skin once a week. 6 mL 3   amLODipine  (NORVASC ) 5 MG tablet Take 1 tablet (5 mg total) by mouth daily. 90 tablet 1   famotidine  (PEPCID ) 20 MG tablet Take 1 tablet (20 mg total) by mouth 2 (two) times daily. (Patient not taking: Reported on 09/29/2023) 60 tablet 0   losartan -hydrochlorothiazide (HYZAAR) 100-25 MG tablet Take 1 tablet by mouth daily. 90 tablet 1   No current facility-administered medications for this visit.    Allergies: Iodine, Lisinopril, Saxagliptin-metformin er, Lansoprazole, Latex, and Sulfamethoxazole-trimethoprim  Past Medical History:  Diagnosis Date   Diabetes mellitus without complication (HCC)    Hypertension    Iron deficiency anemia, unspecified 07/10/2012   Type II or unspecified type diabetes mellitus without mention of complication, not stated as uncontrolled 07/10/2012    Past Surgical History:  Procedure Laterality Date   BACK SURGERY     CESAREAN SECTION     CHOLECYSTECTOMY     TUBAL LIGATION      Family History  Problem Relation Age of Onset   Diabetes Mother    Hypertension Father    Heart disease Father    Diabetes Father    Asthma Sister    Asthma Sister     Social History   Tobacco Use  Smoking status: Never   Smokeless tobacco: Never  Substance Use Topics   Alcohol use: Yes    Comment: occasional    Subjective:   Presents for yearly CPE;  overdue for numerous preventive care issues; prefers to come back for pap smear at later date; prefers to update Pneumonia vaccine at later date; agrees to Tdap today; needs referral for colonoscopy and ophthalmology; working with endocrine for management of Type 2 Diabetes- last seen there 2 weeks ago;   Review of Systems  Constitutional: Negative.   HENT: Negative.    Eyes: Negative.   Respiratory: Negative.    Cardiovascular: Negative.    Gastrointestinal: Negative.   Genitourinary: Negative.   Musculoskeletal: Negative.   Skin: Negative.   Neurological: Negative.   Endo/Heme/Allergies: Negative.   Psychiatric/Behavioral: Negative.       Objective:  Vitals:   09/29/23 1308  BP: 122/64  Pulse: 81  Temp: 98.8 F (37.1 C)  TempSrc: Oral  SpO2: 95%  Weight: 212 lb 12.8 oz (96.5 kg)  Height: 5' 1 (1.549 m)    General: Well developed, well nourished, in no acute distress  Skin : Warm and dry.  Head: Normocephalic and atraumatic  Eyes: Sclera and conjunctiva clear; pupils round and reactive to light; extraocular movements intact  Ears: External normal; canals clear; tympanic membranes normal  Oropharynx: Pink, supple. No suspicious lesions  Neck: Supple without thyromegaly, adenopathy  Lungs: Respirations unlabored; clear to auscultation bilaterally without wheeze, rales, rhonchi  CVS exam: normal rate and regular rhythm.  Abdomen: Soft; nontender; nondistended; normoactive bowel sounds; no masses or hepatosplenomegaly  Musculoskeletal: No deformities; no active joint inflammation  Extremities: No edema, cyanosis, clubbing  Vessels: Symmetric bilaterally  Neurologic: Alert and oriented; speech intact; face symmetrical; moves all extremities well; CNII-XII intact without focal deficit   Assessment:  1. PE (physical exam), annual   2. Encounter for screening colonoscopy   3. Visit for screening mammogram   4. Lipid screening     Plan:  Age appropriate preventive healthcare needs addressed; encouraged regular eye doctor and dental exams; encouraged regular exercise; will update labs and refills as needed today; follow-up to be determined; Patient prefers to return at later date for pap smear and Prevnar; defers Shingrix today;  Referral to ophthalmology and GI updated today;  Tdap updated;   No follow-ups on file.  Orders Placed This Encounter  Procedures   MM Digital Screening    Standing Status:    Future    Expiration Date:   09/28/2024    Reason for Exam (SYMPTOM  OR DIAGNOSIS REQUIRED):   screening mammogram    Is the patient pregnant?:   No    Preferred imaging location?:   [facility]   CBC with Differential/Platelet   Comp Met (CMET)   Lipid panel   Ambulatory referral to Gastroenterology    Referral Priority:   Routine    Referral Type:   Consultation    Referral Reason:   Specialty Services Required    Number of Visits Requested:   1    Requested Prescriptions    No prescriptions requested or ordered in this encounter

## 2023-09-29 NOTE — Addendum Note (Signed)
 Addended by: TRUDY CURVIN RAMAN on: 09/29/2023 02:05 PM   Modules accepted: Orders

## 2023-10-02 ENCOUNTER — Telehealth: Payer: Self-pay

## 2023-10-02 NOTE — Telephone Encounter (Signed)
 Copied from CRM #[id number]. Topic: General - Other >> Oct 02, 2023  3:43 PM Turkey A wrote: Reason for CRM: Crystal from [facility] called to inform office that they do not accept patient's insurance.

## 2023-10-03 ENCOUNTER — Other Ambulatory Visit (INDEPENDENT_AMBULATORY_CARE_PROVIDER_SITE_OTHER)

## 2023-10-03 DIAGNOSIS — Z13220 Encounter for screening for lipoid disorders: Secondary | ICD-10-CM | POA: Diagnosis not present

## 2023-10-03 DIAGNOSIS — Z0000 Encounter for general adult medical examination without abnormal findings: Secondary | ICD-10-CM | POA: Diagnosis not present

## 2023-10-04 ENCOUNTER — Ambulatory Visit: Payer: Self-pay | Admitting: Family

## 2023-10-04 LAB — COMPREHENSIVE METABOLIC PANEL WITH GFR
ALT: 11 U/L (ref 0–35)
AST: 14 U/L (ref 0–37)
Albumin: 4 g/dL (ref 3.5–5.2)
Alkaline Phosphatase: 74 U/L (ref 39–117)
BUN: 10 mg/dL (ref 6–23)
CO2: 30 meq/L (ref 19–32)
Calcium: 8.8 mg/dL (ref 8.4–10.5)
Chloride: 100 meq/L (ref 96–112)
Creatinine, Ser: 1.02 mg/dL (ref 0.40–1.20)
GFR: 60.95 mL/min (ref >60.00)
Glucose, Bld: 87 mg/dL (ref 70–99)
Potassium: 3.9 meq/L (ref 3.5–5.1)
Sodium: 138 meq/L (ref 135–145)
Total Bilirubin: 0.3 mg/dL (ref 0.2–1.2)
Total Protein: 7.7 g/dL (ref 6.0–8.3)

## 2023-10-04 LAB — CBC WITH DIFFERENTIAL/PLATELET
Basophils Absolute: 0.1 K/uL (ref 0.0–0.1)
Basophils Relative: 0.9 % (ref 0.0–3.0)
Eosinophils Absolute: 0.5 K/uL (ref 0.0–0.7)
Eosinophils Relative: 4.7 % (ref 0.0–5.0)
HCT: 37 % (ref 36.0–46.0)
Hemoglobin: 11.3 g/dL — ABNORMAL LOW (ref 12.0–15.0)
Lymphocytes Relative: 23.2 % (ref 12.0–46.0)
Lymphs Abs: 2.3 K/uL (ref 0.7–4.0)
MCHC: 30.6 g/dL (ref 30.0–36.0)
MCV: 70.3 fl — ABNORMAL LOW (ref 78.0–100.0)
Monocytes Absolute: 0.6 K/uL (ref 0.1–1.0)
Monocytes Relative: 6.1 % (ref 3.0–12.0)
Neutro Abs: 6.4 K/uL (ref 1.4–7.7)
Neutrophils Relative %: 65.1 % (ref 43.0–77.0)
Platelets: 434 K/uL — ABNORMAL HIGH (ref 150.0–400.0)
RBC: 5.26 Mil/uL — ABNORMAL HIGH (ref 3.87–5.11)
RDW: 16.5 % — ABNORMAL HIGH (ref 11.5–15.5)
WBC: 9.9 K/uL (ref 4.0–10.5)

## 2023-10-04 LAB — LIPID PANEL
Cholesterol: 121 mg/dL (ref 0–200)
HDL: 31.9 mg/dL — ABNORMAL LOW (ref >39.00)
LDL Cholesterol: 68 mg/dL (ref 0–99)
NonHDL: 88.67
Total CHOL/HDL Ratio: 4
Triglycerides: 105 mg/dL (ref 0.0–149.0)
VLDL: 21 mg/dL (ref 0.0–40.0)

## 2023-10-20 ENCOUNTER — Other Ambulatory Visit (HOSPITAL_BASED_OUTPATIENT_CLINIC_OR_DEPARTMENT_OTHER): Payer: Self-pay

## 2023-10-23 ENCOUNTER — Ambulatory Visit: Admitting: Family Medicine

## 2023-10-23 ENCOUNTER — Other Ambulatory Visit (HOSPITAL_BASED_OUTPATIENT_CLINIC_OR_DEPARTMENT_OTHER): Payer: Self-pay

## 2023-10-23 ENCOUNTER — Encounter: Payer: Self-pay | Admitting: Family Medicine

## 2023-10-23 ENCOUNTER — Ambulatory Visit: Payer: Self-pay

## 2023-10-23 VITALS — BP 129/83 | HR 83 | Temp 98.1°F | Ht 61.0 in | Wt 213.0 lb

## 2023-10-23 DIAGNOSIS — R051 Acute cough: Secondary | ICD-10-CM

## 2023-10-23 MED ORDER — HYDROCOD POLI-CHLORPHE POLI ER 10-8 MG/5ML PO SUER
5.0000 mL | Freq: Two times a day (BID) | ORAL | 0 refills | Status: AC | PRN
  Filled 2023-10-23: qty 50, 5d supply, fill #0

## 2023-10-23 MED ORDER — GUAIFENESIN ER 600 MG PO TB12
1200.0000 mg | ORAL_TABLET | Freq: Two times a day (BID) | ORAL | 0 refills | Status: DC
  Filled 2023-10-23: qty 40, 10d supply, fill #0

## 2023-10-23 MED ORDER — BENZONATATE 200 MG PO CAPS
200.0000 mg | ORAL_CAPSULE | Freq: Three times a day (TID) | ORAL | 1 refills | Status: DC | PRN
  Filled 2023-10-23: qty 45, 15d supply, fill #0

## 2023-10-23 NOTE — Telephone Encounter (Signed)
 FYI Only or Action Required?: Action required by provider: request for appointment.  Patient was last seen in primary care on 09/29/2023 by Jason Leita Repine, FNP.  Called Nurse Triage reporting Cough.  Symptoms began several days ago.  Interventions attempted: OTC medications: allergy medicine.  Symptoms are: gradually worsening. Productive cough, runny nose, chest and shoulder pain from coughing.  Triage Disposition: See Physician Within 24 Hours  Patient/caregiver understands and will follow disposition?: Yes   Copied from CRM #[id number]. Topic: Clinical - Red Word Triage >> Oct 23, 2023  8:27 AM Amy B wrote: Red Word that prompted transfer to Nurse Triage: Severe cough, chest and shoulder pain, blood in sputum. Answer Assessment - Initial Assessment Questions 1. ONSET: When did the cough begin?      Last week 2. SEVERITY: How bad is the cough today?      severe 3. SPUTUM: Describe the color of your sputum (e.g., none, dry cough; clear, white, yellow, green)     white 4. HEMOPTYSIS: Are you coughing up any blood? If Yes, ask: How much? (e.g., flecks, streaks, tablespoons, etc.)     Small of blood 5. DIFFICULTY BREATHING: Are you having difficulty breathing? If Yes, ask: How bad is it? (e.g., mild, moderate, severe)      no 6. FEVER: Do you have a fever? If Yes, ask: What is your temperature, how was it measured, and when did it start?     no 7. CARDIAC HISTORY: Do you have any history of heart disease? (e.g., heart attack, congestive heart failure)      HTN 8. LUNG HISTORY: Do you have any history of lung disease?  (e.g., pulmonary embolus, asthma, emphysema)     NO 9. PE RISK FACTORS: Do you have a history of blood clots? (or: recent major surgery, recent prolonged travel, bedridden)     no 10. OTHER SYMPTOMS: Do you have any other symptoms? (e.g., runny nose, wheezing, chest pain)       Runny nose , chest shoulder pain from coughing 11.  PREGNANCY: Is there any chance you are pregnant? When was your last menstrual period?       no 12. TRAVEL: Have you traveled out of the country in the last month? (e.g., travel history, exposures)       no  Protocols used: Cough - Acute Productive-A-AH  Reason for Disposition  [1] Continuous (nonstop) coughing interferes with work or school AND [2] no improvement using cough treatment per Care Advice  Answer Assessment - Initial Assessment Questions 1. ONSET: When did the cough begin?      Last week 2. SEVERITY: How bad is the cough today?      severe 3. SPUTUM: Describe the color of your sputum (e.g., none, dry cough; clear, white, yellow, green)     white 4. HEMOPTYSIS: Are you coughing up any blood? If Yes, ask: How much? (e.g., flecks, streaks, tablespoons, etc.)     Small of blood 5. DIFFICULTY BREATHING: Are you having difficulty breathing? If Yes, ask: How bad is it? (e.g., mild, moderate, severe)      no 6. FEVER: Do you have a fever? If Yes, ask: What is your temperature, how was it measured, and when did it start?     no 7. CARDIAC HISTORY: Do you have any history of heart disease? (e.g., heart attack, congestive heart failure)      HTN 8. LUNG HISTORY: Do you have any history of lung disease?  (e.g., pulmonary embolus, asthma, emphysema)  NO 9. PE RISK FACTORS: Do you have a history of blood clots? (or: recent major surgery, recent prolonged travel, bedridden)     no 10. OTHER SYMPTOMS: Do you have any other symptoms? (e.g., runny nose, wheezing, chest pain)       Runny nose , chest shoulder pain from coughing 11. PREGNANCY: Is there any chance you are pregnant? When was your last menstrual period?       no 12. TRAVEL: Have you traveled out of the country in the last month? (e.g., travel history, exposures)       no  Protocols used: Cough - Acute Productive-A-AH

## 2023-10-23 NOTE — Progress Notes (Signed)
 Acute Office Visit  Subjective:     Patient ID: Michelle Mercer, female    DOB: 11/18/1965, 58 y.o.   MRN: [id number]  Chief Complaint  Patient presents with   Cough     Patient is in today for cough.  Discussed the use of AI scribe software for clinical note transcription with the patient, who gave verbal consent to proceed.   History of Present Illness Michelle Mercer is a 58 year old female who presents with a persistent cough following suspected COVID-19 exposure at work.  She has been experiencing symptoms for about a week, with the onset around last Monday or Tuesday. Her primary symptom is a persistent, mostly dry cough that has been interfering with her sleep. Initially, she also had body aches, a runny nose, and a sore throat. She denies having fevers but experienced headaches early on, specifically on Friday and Saturday.  She has been managing her symptoms at home with allergy medication and cough medicine, likely Robitussin, which provides short-term relief. She reports diminished taste and smell, affecting her appetite, but she maintains adequate hydration and nutrition despite these symptoms.  No current fever, ear pain, hemoptysis or significant breathing difficulties.            All review of systems negative except what is listed in the HPI      Objective:    BP 129/83   Pulse 83   Temp 98.1 F (36.7 C) (Oral)   Ht 5' 1 (1.549 m)   Wt 213 lb (96.6 kg)   SpO2 99%   BMI 40.25 kg/m    Physical Exam Vitals reviewed.  Constitutional:      Appearance: Normal appearance. She is obese.  Cardiovascular:     Rate and Rhythm: Normal rate and regular rhythm.     Heart sounds: Normal heart sounds.  Pulmonary:     Effort: Pulmonary effort is normal.     Breath sounds: Normal breath sounds. No wheezing, rhonchi or rales.     Comments: Frequent dry cough Skin:    General: Skin is warm and dry.  Neurological:     Mental Status: She is alert and  oriented to person, place, and time.  Psychiatric:        Mood and Affect: Mood normal.        Behavior: Behavior normal.        Thought Content: Thought content normal.        Judgment: Judgment normal.           No results found for any visits on 10/23/23.      Assessment & Plan:   Problem List Items Addressed This Visit   None Visit Diagnoses       Acute cough    -  Primary   Relevant Medications   benzonatate  (TESSALON ) 200 MG capsule   chlorpheniramine-HYDROcodone  (TUSSIONEX) 10-8 MG/5ML   guaiFENesin  (MUCINEX ) 600 MG 12 hr tablet       Assessment & Plan Acute post-viral cough (likely post-COVID) Cough likely post-COVID, dry with runny nose, sore throat, diminished taste and smell. No fever or significant respiratory distress. Clear lung sounds.  - Prescribed Tessalon  Perles for cough, up to three times daily. - Prescribed Tussionex cough syrup for nighttime, cautioned against driving due to drowsiness. - Prescribed Mucinex  1200 mg twice daily - Advised mask use at work, especially around children, due to cough. - Provided work note for return on Wednesday if symptoms improve. - Instructed to monitor for  red flag symptoms and report for potential chest x-ray if she occurs.     Meds ordered this encounter  Medications   benzonatate  (TESSALON ) 200 MG capsule    Sig: Take 1 capsule (200 mg total) by mouth 3 (three) times daily as needed for cough.    Dispense:  45 capsule    Refill:  1    Supervising Provider:   DOMENICA BLACKBIRD A [[id number]]   chlorpheniramine-HYDROcodone  (TUSSIONEX) 10-8 MG/5ML    Sig: Take 5 mLs by mouth every 12 (twelve) hours as needed for up to 5 days.    Dispense:  50 mL    Refill:  0    Supervising Provider:   DOMENICA BLACKBIRD A [[id number]]   guaiFENesin  (MUCINEX ) 600 MG 12 hr tablet    Sig: Take 2 tablets (1,200 mg total) by mouth 2 (two) times daily.    Dispense:  40 tablet    Refill:  0    Supervising Provider:   DOMENICA BLACKBIRD A [[id number]]     Return if symptoms worsen or fail to improve.  Waddell KATHEE Mon, NP

## 2023-10-24 ENCOUNTER — Encounter: Payer: Self-pay | Admitting: Family Medicine

## 2023-10-30 ENCOUNTER — Other Ambulatory Visit (HOSPITAL_COMMUNITY): Payer: Self-pay

## 2023-10-31 ENCOUNTER — Other Ambulatory Visit (HOSPITAL_BASED_OUTPATIENT_CLINIC_OR_DEPARTMENT_OTHER): Payer: Self-pay

## 2023-11-01 ENCOUNTER — Encounter (HOSPITAL_BASED_OUTPATIENT_CLINIC_OR_DEPARTMENT_OTHER): Payer: Self-pay

## 2023-11-01 ENCOUNTER — Ambulatory Visit (HOSPITAL_BASED_OUTPATIENT_CLINIC_OR_DEPARTMENT_OTHER)
Admission: RE | Admit: 2023-11-01 | Discharge: 2023-11-01 | Disposition: A | Discharge status: Home / Self Care | Source: Ambulatory Visit | Attending: Family | Admitting: Family

## 2023-11-01 DIAGNOSIS — Z1231 Encounter for screening mammogram for malignant neoplasm of breast: Secondary | ICD-10-CM | POA: Diagnosis present

## 2023-11-06 ENCOUNTER — Ambulatory Visit (HOSPITAL_BASED_OUTPATIENT_CLINIC_OR_DEPARTMENT_OTHER)

## 2023-11-17 ENCOUNTER — Other Ambulatory Visit (HOSPITAL_BASED_OUTPATIENT_CLINIC_OR_DEPARTMENT_OTHER): Payer: Self-pay

## 2023-12-19 ENCOUNTER — Encounter: Payer: Self-pay | Admitting: Internal Medicine

## 2023-12-19 ENCOUNTER — Ambulatory Visit: Admitting: Family

## 2023-12-19 ENCOUNTER — Ambulatory Visit: Admitting: Internal Medicine

## 2023-12-19 VITALS — BP 152/72 | Ht 61.0 in | Wt 212.0 lb

## 2023-12-19 DIAGNOSIS — K5900 Constipation, unspecified: Secondary | ICD-10-CM

## 2023-12-19 DIAGNOSIS — E785 Hyperlipidemia, unspecified: Secondary | ICD-10-CM

## 2023-12-19 DIAGNOSIS — E1129 Type 2 diabetes mellitus with other diabetic kidney complication: Secondary | ICD-10-CM

## 2023-12-19 DIAGNOSIS — R809 Proteinuria, unspecified: Secondary | ICD-10-CM

## 2023-12-19 DIAGNOSIS — E1165 Type 2 diabetes mellitus with hyperglycemia: Secondary | ICD-10-CM

## 2023-12-19 DIAGNOSIS — Z794 Long term (current) use of insulin: Secondary | ICD-10-CM

## 2023-12-19 LAB — POCT GLYCOSYLATED HEMOGLOBIN (HGB A1C): Hemoglobin A1C: 7.4 % — AB (ref 4.0–5.6)

## 2023-12-19 MED ORDER — LANTUS SOLOSTAR 100 UNIT/ML ~~LOC~~ SOPN: 80.0000 [IU] | PEN_INJECTOR | Freq: Every day | SUBCUTANEOUS | 11 refills | Status: AC

## 2023-12-19 MED ORDER — TIRZEPATIDE 10 MG/0.5ML ~~LOC~~ SOAJ
10.0000 mg | SUBCUTANEOUS | 3 refills | Status: DC
  Filled 2024-01-01: qty 2, 28d supply, fill #0

## 2023-12-19 NOTE — Patient Instructions (Addendum)
 Use Miralax as needed for constipation    - Farxiga  10 mg, 1 tablet every morning  - Pioglitazone  45 mg weekly - Increase Mounjaro  10 mg weekly  - Decrease Lantus  80 units once daily  - Continue Humalog  14 units with each meal  - Take Humalog  6 units with a snack  - Humalog  correctional insulin : ADD extra units on insulin  to your meal-time Humalog  dose if your blood sugars are higher than 150. Use the scale below to help guide you:   Blood sugar before meal Number of units to inject  Less than 150 0 unit  151 -  170 1 units  171 -  190 2 units  191 -  210 3 units  211 -  230 4 units  231 -  250 5 units  251 -  270 6 units  271 -  290 7 units  291 -  310 8 units  311 - 330 9 units    HOW TO TREAT LOW BLOOD SUGARS (Blood sugar LESS THAN 70 MG/DL) Please follow the RULE OF 15 for the treatment of hypoglycemia treatment (when your (blood sugars are less than 70 mg/dL)   STEP 1: Take 15 grams of carbohydrates when your blood sugar is low, which includes:  3-4 GLUCOSE TABS  OR 3-4 OZ OF JUICE OR REGULAR SODA OR ONE TUBE OF GLUCOSE GEL    STEP 2: RECHECK blood sugar in 15 MINUTES STEP 3: If your blood sugar is still low at the 15 minute recheck --> then, go back to STEP 1 and treat AGAIN with another 15 grams of carbohydrates.

## 2023-12-19 NOTE — Progress Notes (Signed)
 Name: Michelle Mercer  Age/ Sex: 58 y.o., female   MRN/ DOB: [id number], May 20, 1965     PCP: Jason Leita Repine, FNP (Inactive)   Reason for Endocrinology Evaluation: Type 2 Diabetes Mellitus  Initial Endocrine Consultative Visit: 08/11/2020    PATIENT IDENTIFIER: [name] is a 58 y.o. female with a past medical history of T2DM, HTN and dyslipidemia . The patient has followed with Endocrinology clinic since 08/11/2020 for consultative assistance with management of her diabetes.     DIABETIC HISTORY:  [name] was diagnosed with DM in her 28's, Metformin - diarrhea . Her hemoglobin A1c has ranged from 7.1% in 2021, peaking at 15.0% in 2020.  On her initial visit to our clinic she had an A1c of 7.7%  , she was on Jardiance , Ozempic , Pioglitazone  and MDI regimen   Switch Trulicity  to Mounjaro  10/2022   SUBJECTIVE:   During the last visit (09/12/2023): A1c 9.3%     Today (12/19/2023): [name] is here for a follow up on diabetes management. She checks glucose multiple times daily though dexcom. She has hypoglycemia overnight , she is symptomatic    No nausea  Has occasional constipation     DIABETES REGIMEN:  Farxiga  10 mg, 1 tablet every morning Pioglitazone  45 mg daily Mounjaro  7.5 mg weekly Lantus  90 units once daily  Humalog  14 units TIDQAC Humalog  6 units with snacks  CF: Humalog  (BG -130/20)   Statin: no ACE-I/ARB: yes    CONTINUOUS GLUCOSE MONITORING RECORD INTERPRETATION    Dates of Recording: 9/30-10/13/2025  Sensor description: dexcom  Results statistics:   CGM use % of time 89  Average and SD 142/41  Time in range    84    %  % Time Above 180 14  % Time above 250 2  % Time Below target 0   Glycemic patterns summary: BGs are optimal overnight and fluctuate during the day  Hyperglycemic episodes postprandial  Hypoglycemic episodes occurred N/A  Overnight periods: Optimal  DIABETIC COMPLICATIONS: Microvascular  complications:   Denies: CKD, retinopathy neuropathy Last Eye Exam: Completed > 1 yr ago   Macrovascular complications:   Denies: CAD, CVA, PVD   HISTORY:  Past Medical History:  Past Medical History:  Diagnosis Date   Diabetes mellitus without complication (HCC)    Hypertension    Iron deficiency anemia, unspecified 07/10/2012   Type II or unspecified type diabetes mellitus without mention of complication, not stated as uncontrolled 07/10/2012   Past Surgical History:  Past Surgical History:  Procedure Laterality Date   BACK SURGERY     CESAREAN SECTION     CHOLECYSTECTOMY     TUBAL LIGATION     Social History:  reports that she has never smoked. She has never used smokeless tobacco. She reports current alcohol use. She reports that she does not use drugs. Family History:  Family History  Problem Relation Age of Onset   Diabetes Mother    Hypertension Father    Heart disease Father    Diabetes Father    Asthma Sister    Asthma Sister      HOME MEDICATIONS: Allergies as of 12/19/2023       Reactions   Iodine Itching   Patient not allergic to CT contrast. Had CT IV contrast on 12/07/21 (authorized by Dr. Darra) Patient states she is allergic to iodine but not allergic to CT IV contrast. Patient had no reaction to the CT IV contrast on 12/07/21.    Lisinopril Nausea  And Vomiting, Cough   Saxagliptin-metformin Er Diarrhea   Lansoprazole Hives, Nausea Only   Latex Rash   Sulfamethoxazole-trimethoprim Rash        Medication List        Accurate as of December 19, 2023 10:58 AM. If you have any questions, ask your nurse or doctor.          STOP taking these medications    benzonatate  200 MG capsule Commonly known as: TESSALON  Stopped by: Donell PARAS Lilyan Prete   guaiFENesin  600 MG 12 hr tablet Commonly known as: Mucinex  Stopped by: Donell PARAS Shayley Medlin   methocarbamol  500 MG tablet Commonly known as: ROBAXIN  Stopped by: Donell PARAS Linet Brash   Mounjaro   7.5 MG/0.5ML Pen Generic drug: tirzepatide  Replaced by: tirzepatide  10 MG/0.5ML Pen Stopped by: Suzzette Gasparro J Nikkolas Coomes       TAKE these medications    Accu-Chek Guide Test test strip Generic drug: glucose blood Use as instructed 3 (three) times daily.   Accu-Chek Guide w/Device Kit Use as instructed 3 (three) times daily.   amLODipine  5 MG tablet Commonly known as: NORVASC  Take 1 tablet (5 mg total) by mouth daily.   aspirin  EC 81 MG tablet Commonly known as: [facility] Aspirin  Low Dose Take 1 tablet (81 mg total) by mouth daily.   dapagliflozin  propanediol 10 MG Tabs tablet Commonly known as: Farxiga  Take 1 tablet (10 mg total) by mouth daily before breakfast.   Dexcom G7 Sensor Misc USe as directed and change every 10 days.   famotidine  20 MG tablet Commonly known as: PEPCID  Take 1 tablet (20 mg total) by mouth 2 (two) times daily.   insulin  lispro 100 UNIT/ML KwikPen Commonly known as: HumaLOG  KwikPen Max daily 60 units per scale   Insulin  Pen Needle 32G X 4 MM Misc 1 Device by Does not apply route in the morning, at noon, in the evening, and at bedtime.   Lantus  SoloStar 100 UNIT/ML Solostar Pen Generic drug: insulin  glargine Inject 80 Units into the skin daily. What changed: how much to take Changed by: Donell PARAS Hamilton Marinello   losartan -hydrochlorothiazide 100-25 MG tablet Commonly known as: HYZAAR Take 1 tablet by mouth daily.   pioglitazone  45 MG tablet Commonly known as: ACTOS  Take 1 tablet (45 mg total) by mouth daily.   tirzepatide  10 MG/0.5ML Pen Commonly known as: MOUNJARO  Inject 10 mg into the skin once a week. Replaces: Mounjaro  7.5 MG/0.5ML Pen Started by: Zaidyn Claire J Xitlaly Ault   VITAMIN B 12 PO Take by mouth every morning.   Vitamin D-3 125 MCG (5000 UT) Tabs Take 1 tablet by mouth daily.         OBJECTIVE:   Vital Signs: BP (!) 152/72   Ht 5' 1 (1.549 m)   Wt 212 lb (96.2 kg)   BMI 40.06 kg/m   Wt Readings from Last 3 Encounters:   12/19/23 212 lb (96.2 kg)  10/23/23 213 lb (96.6 kg)  09/29/23 212 lb 12.8 oz (96.5 kg)     Exam: General: Pt appears well and is in NAD  Lungs: Clear with good BS bilat   Heart: RRR  Extremities: Trace  pretibial edema.   Neuro: MS is good with appropriate affect, pt is alert and Ox3    DM foot exam: 12/19/2023  The skin of the feet is intact without sores or ulcerations. The pedal pulses are 2+ on right and 2+ on left. The sensation is intact to a screening 5.07, 10 gram monofilament bilaterally  DATA REVIEWED:  Lab Results  Component Value Date   HGBA1C 7.4 (A) 12/19/2023   HGBA1C 9.3 (A) 09/12/2023   HGBA1C 11.7 (A) 06/14/2023    Latest Reference Range & Units 10/03/23 16:35  Sodium 135 - 145 mEq/L 138  Potassium 3.5 - 5.1 mEq/L 3.9  Chloride 96 - 112 mEq/L 100  CO2 19 - 32 mEq/L 30  Glucose 70 - 99 mg/dL 87  BUN 6 - 23 mg/dL 10  Creatinine 9.59 - 8.79 mg/dL 8.97  Calcium 8.4 - 89.4 mg/dL 8.8  Alkaline Phosphatase 39 - 117 U/L 74  Albumin 3.5 - 5.2 g/dL 4.0  AST 0 - 37 U/L 14  ALT 0 - 35 U/L 11  Total Protein 6.0 - 8.3 g/dL 7.7  Total Bilirubin 0.2 - 1.2 mg/dL 0.3  GFR >39.99 mL/min 60.95    Latest Reference Range & Units 10/03/23 16:35  Total CHOL/HDL Ratio  4  Cholesterol 0 - 200 mg/dL 878  HDL Cholesterol >60.99 mg/dL 68.09 (L)  LDL (calc) 0 - 99 mg/dL 68  NonHDL  11.32  Triglycerides 0.0 - 149.0 mg/dL 894.9  VLDL 0.0 - 59.9 mg/dL 78.9    ASSESSMENT / PLAN / RECOMMENDATIONS:   1) Type 2 Diabetes Mellitus, Sub-optimally controlled, With microalbuminuria  - Most recent A1c of 7.4%. Goal A1c < 7.0 %.    - A1c has trended down from 9.3% to 7.4% - Will increase Mounjaro  - Patient endorses tight/hypoglycemia overnight, will decrease Lantus  as below  MEDICATIONS: Increase Mounjaro  10 mg weekly Continue Farxiga  10 mg, 1 tablet every morning  Continue pioglitazone  45 mg daily Decrease Lantus  80 units once daily  Continue Humalog  14 units 3  times daily before every meal Continue Humalog  6 units with snack Correction Scale: Humalog  (BG -130/20) 3 times daily before every meal  EDUCATION / INSTRUCTIONS: BG monitoring instructions: Patient is instructed to check her blood sugars 3 times a day, before each meal . Call [facility] if: BG persistently < 70  I reviewed the Rule of 15 for the treatment of hypoglycemia in detail with the patient. Literature supplied.    2) Diabetic complications:  Eye: Does not have known diabetic retinopathy.  Neuro/ Feet: Does not have known diabetic peripheral neuropathy .  Renal: Patient does not have known baseline CKD. She   is  on an ACEI/ARB at present.   3) Dyslipidemia:  - Lipid panel acceptable  4) Microalbuminuria  - The patient is already on losartan  and Farxiga  - Will encourage glucose control   5) Constipation:  -Encouraged water intake, daily fiber intake - Patient is MiraLAX as needed  F/U in 6 months     Signed electronically by: Stefano Redgie Butts, MD  [facility] Endocrinology  [facility] [address] [place], [place] [address] Phone: [phone] FAX: [phone]   CC: Jason Leita Repine, FNP (Inactive) No address on file Phone: None  Fax: None  Return to Endocrinology clinic as below: Future Appointments  Date Time Provider Department Center  03/25/2024 10:00 AM Almarie Waddell NOVAK, NP [id number] [id number]

## 2023-12-22 ENCOUNTER — Other Ambulatory Visit (HOSPITAL_BASED_OUTPATIENT_CLINIC_OR_DEPARTMENT_OTHER): Payer: Self-pay

## 2023-12-22 ENCOUNTER — Other Ambulatory Visit: Payer: Self-pay | Admitting: Internal Medicine

## 2024-01-01 ENCOUNTER — Encounter: Payer: Self-pay | Admitting: Internal Medicine

## 2024-01-01 ENCOUNTER — Emergency Department (HOSPITAL_BASED_OUTPATIENT_CLINIC_OR_DEPARTMENT_OTHER)
Admission: EM | Admit: 2024-01-01 | Discharge: 2024-01-01 | Disposition: A | Discharge status: Home / Self Care | Attending: Emergency Medicine | Admitting: Emergency Medicine

## 2024-01-01 ENCOUNTER — Other Ambulatory Visit: Payer: Self-pay

## 2024-01-01 ENCOUNTER — Encounter (HOSPITAL_BASED_OUTPATIENT_CLINIC_OR_DEPARTMENT_OTHER): Payer: Self-pay | Admitting: Emergency Medicine

## 2024-01-01 ENCOUNTER — Other Ambulatory Visit (HOSPITAL_BASED_OUTPATIENT_CLINIC_OR_DEPARTMENT_OTHER): Payer: Self-pay

## 2024-01-01 DIAGNOSIS — Z7984 Long term (current) use of oral hypoglycemic drugs: Secondary | ICD-10-CM | POA: Diagnosis not present

## 2024-01-01 DIAGNOSIS — R1013 Epigastric pain: Secondary | ICD-10-CM | POA: Diagnosis present

## 2024-01-01 DIAGNOSIS — Z79899 Other long term (current) drug therapy: Secondary | ICD-10-CM | POA: Insufficient documentation

## 2024-01-01 DIAGNOSIS — R42 Dizziness and giddiness: Secondary | ICD-10-CM | POA: Diagnosis not present

## 2024-01-01 DIAGNOSIS — R110 Nausea: Secondary | ICD-10-CM | POA: Diagnosis not present

## 2024-01-01 DIAGNOSIS — Z7982 Long term (current) use of aspirin: Secondary | ICD-10-CM | POA: Insufficient documentation

## 2024-01-01 DIAGNOSIS — Z794 Long term (current) use of insulin: Secondary | ICD-10-CM | POA: Diagnosis not present

## 2024-01-01 DIAGNOSIS — E119 Type 2 diabetes mellitus without complications: Secondary | ICD-10-CM | POA: Insufficient documentation

## 2024-01-01 DIAGNOSIS — R079 Chest pain, unspecified: Secondary | ICD-10-CM | POA: Diagnosis not present

## 2024-01-01 DIAGNOSIS — I10 Essential (primary) hypertension: Secondary | ICD-10-CM | POA: Insufficient documentation

## 2024-01-01 DIAGNOSIS — Z91040 Latex allergy status: Secondary | ICD-10-CM | POA: Diagnosis not present

## 2024-01-01 LAB — URINALYSIS, ROUTINE W REFLEX MICROSCOPIC
Bacteria, UA: NONE SEEN
Bilirubin Urine: NEGATIVE
Glucose, UA: 250 mg/dL — AB
Hgb urine dipstick: NEGATIVE
Ketones, ur: NEGATIVE mg/dL
Leukocytes,Ua: NEGATIVE
Nitrite: NEGATIVE
Protein, ur: 30 mg/dL — AB
Specific Gravity, Urine: 1.013 (ref 1.005–1.030)
pH: 6.5 (ref 5.0–8.0)

## 2024-01-01 LAB — CBC WITH DIFFERENTIAL/PLATELET
Abs Immature Granulocytes: 0.02 K/uL (ref 0.00–0.07)
Basophils Absolute: 0.1 K/uL (ref 0.0–0.1)
Basophils Relative: 1 %
Eosinophils Absolute: 0.3 K/uL (ref 0.0–0.5)
Eosinophils Relative: 4 %
HCT: 36.5 % (ref 36.0–46.0)
Hemoglobin: 11.3 g/dL — ABNORMAL LOW (ref 12.0–15.0)
Immature Granulocytes: 0 %
Lymphocytes Relative: 28 %
Lymphs Abs: 2.2 K/uL (ref 0.7–4.0)
MCH: 22 pg — ABNORMAL LOW (ref 26.0–34.0)
MCHC: 31 g/dL (ref 30.0–36.0)
MCV: 71 fL — ABNORMAL LOW (ref 80.0–100.0)
Monocytes Absolute: 0.6 K/uL (ref 0.1–1.0)
Monocytes Relative: 8 %
Neutro Abs: 4.8 K/uL (ref 1.7–7.7)
Neutrophils Relative %: 59 %
Platelets: 459 K/uL — ABNORMAL HIGH (ref 150–400)
RBC: 5.14 MIL/uL — ABNORMAL HIGH (ref 3.87–5.11)
RDW: 17.2 % — ABNORMAL HIGH (ref 11.5–15.5)
WBC: 8 K/uL (ref 4.0–10.5)
nRBC: 0 % (ref 0.0–0.2)

## 2024-01-01 LAB — TROPONIN T, HIGH SENSITIVITY: Troponin T High Sensitivity: <15 ng/L (ref 0–19)

## 2024-01-01 LAB — COMPREHENSIVE METABOLIC PANEL WITH GFR
ALT: 8 U/L (ref 0–44)
AST: 13 U/L — ABNORMAL LOW (ref 15–41)
Albumin: 3.8 g/dL (ref 3.5–5.0)
Alkaline Phosphatase: 84 U/L (ref 38–126)
Anion gap: 8 (ref 5–15)
BUN: 10 mg/dL (ref 6–20)
CO2: 29 mmol/L (ref 22–32)
Calcium: 9.5 mg/dL (ref 8.9–10.3)
Chloride: 105 mmol/L (ref 98–111)
Creatinine, Ser: 0.98 mg/dL (ref 0.44–1.00)
GFR, Estimated: >60 mL/min (ref >60)
Glucose, Bld: 163 mg/dL — ABNORMAL HIGH (ref 70–99)
Potassium: 3.6 mmol/L (ref 3.5–5.1)
Sodium: 141 mmol/L (ref 135–145)
Total Bilirubin: 0.3 mg/dL (ref 0.0–1.2)
Total Protein: 7.3 g/dL (ref 6.5–8.1)

## 2024-01-01 LAB — LIPASE, BLOOD: Lipase: 30 U/L (ref 11–51)

## 2024-01-01 MED ORDER — ALUM & MAG HYDROXIDE-SIMETH 200-200-20 MG/5ML PO SUSP
30.0000 mL | Freq: Once | ORAL | Status: AC
Start: 2024-01-01 — End: 2024-01-01
  Administered 2024-01-01: 30 mL via ORAL
  Filled 2024-01-01: qty 30

## 2024-01-01 MED ORDER — TIRZEPATIDE 10 MG/0.5ML ~~LOC~~ SOAJ
10.0000 mg | SUBCUTANEOUS | 3 refills | Status: AC
  Filled 2024-02-07: qty 2, 28d supply, fill #0

## 2024-01-01 MED ORDER — SUCRALFATE 1 G PO TABS
1.0000 g | ORAL_TABLET | Freq: Three times a day (TID) | ORAL | 0 refills | Status: AC
  Filled 2024-01-01: qty 45, 12d supply, fill #0

## 2024-01-01 MED ORDER — FAMOTIDINE 20 MG PO TABS
20.0000 mg | ORAL_TABLET | Freq: Two times a day (BID) | ORAL | 0 refills | Status: AC
  Filled 2024-01-01: qty 60, 30d supply, fill #0

## 2024-01-01 MED ORDER — ONDANSETRON HCL 4 MG/2ML IJ SOLN
4.0000 mg | Freq: Once | INTRAMUSCULAR | Status: AC
  Administered 2024-01-01: 4 mg via INTRAVENOUS
  Filled 2024-01-01: qty 2

## 2024-01-01 MED ORDER — FAMOTIDINE 20 MG PO TABS
20.0000 mg | ORAL_TABLET | Freq: Once | ORAL | Status: AC
  Administered 2024-01-01: 20 mg via ORAL
  Filled 2024-01-01: qty 1

## 2024-01-01 NOTE — Discharge Instructions (Addendum)
 Your lab work did not show any significant abnormality. I suspect your symptoms may be related to acid reflex/ peptic ulcer disease.  A prescription for famotidine  and Carafate  was sent to your pharmacy.  Please take these as instructed.  Please follow-up with your primary care doctor for further evaluation.  If you experience any new or worsening symptoms please return emergency room.

## 2024-01-01 NOTE — ED Triage Notes (Signed)
 Reports nausea, headache, and abdominal pain x 1 week. Last BM 1 week ago.

## 2024-01-01 NOTE — ED Provider Notes (Signed)
  EMERGENCY DEPARTMENT AT [facility] Provider Note   CSN: [id number] Arrival date & time: 01/01/24  1053     Patient presents with: Nausea   Michelle Mercer is a 58 y.o. female with history of diabetes, hypertension evaluated for complaints of abdominal pain with associated nausea and dizziness x 1 week.  She describes pain to her chest and epigastric region.  Without significant shortness of breath or cough.  Her symptoms are worse after eating.  Has had some episodic dizziness as well that occurs with movement.  Has had a mild headache as well intermittently.  She has had a few episodes of emesis and diarrhea as well.  Symptoms are not exertional.  Has no cardiac history.  Does have a remote history of DVT in the setting of extended travel.  No longer on AC.  History of cholecystectomy.   HPI    Past Medical History:  Diagnosis Date   Diabetes mellitus without complication (HCC)    Hypertension    Iron deficiency anemia, unspecified 07/10/2012   Type II or unspecified type diabetes mellitus without mention of complication, not stated as uncontrolled 07/10/2012   Past Surgical History:  Procedure Laterality Date   BACK SURGERY     CESAREAN SECTION     CHOLECYSTECTOMY     TUBAL LIGATION       Prior to Admission medications   Medication Sig Start Date End Date Taking? Authorizing Provider  sucralfate  (CARAFATE ) 1 g tablet Take 1 tablet (1 g total) by mouth 4 (four) times daily -  with meals and at bedtime. 01/01/24  Yes Donnajean Lynwood DEL, PA-C  amLODipine  (NORVASC ) 5 MG tablet Take 1 tablet (5 mg total) by mouth daily. 09/29/23   Jason Leita Repine, FNP  aspirin  EC ([facility] ASPIRIN  LOW DOSE) 81 MG tablet Take 1 tablet (81 mg total) by mouth daily. 08/08/22   Jason Leita Repine, FNP  Blood Glucose Monitoring Suppl (ACCU-CHEK GUIDE) w/Device KIT Use as instructed 3 (three) times daily. 09/12/23   Shamleffer, Ibtehal Jaralla, MD  Cholecalciferol (VITAMIN D-3) 125  MCG (5000 UT) TABS Take 1 tablet by mouth daily.    [provider]  Continuous Glucose Sensor (DEXCOM G7 SENSOR) MISC USe as directed and change every 10 days. 09/12/23   Shamleffer, Ibtehal Jaralla, MD  Cyanocobalamin (VITAMIN B 12 PO) Take by mouth every morning.    [provider]  dapagliflozin  propanediol (FARXIGA ) 10 MG TABS tablet Take 1 tablet (10 mg total) by mouth daily before breakfast. 09/12/23   Shamleffer, Ibtehal Jaralla, MD  famotidine  (PEPCID ) 20 MG tablet Take 1 tablet (20 mg total) by mouth 2 (two) times daily. 01/01/24   Donnajean Lynwood DEL, PA-C  glucose blood (ACCU-CHEK GUIDE TEST) test strip Use as instructed 3 (three) times daily. 09/12/23   Shamleffer, Ibtehal Jaralla, MD  insulin  glargine (LANTUS  SOLOSTAR) 100 UNIT/ML Solostar Pen Inject 80 Units into the skin daily. 12/19/23   Shamleffer, Ibtehal Jaralla, MD  insulin  lispro (HUMALOG  KWIKPEN) 100 UNIT/ML KwikPen Max daily 60 units per scale 09/12/23   Shamleffer, Ibtehal Jaralla, MD  Insulin  Pen Needle 32G X 4 MM MISC 1 Device by Does not apply route in the morning, at noon, in the evening, and at bedtime. 09/12/23   Shamleffer, Ibtehal Jaralla, MD  losartan -hydrochlorothiazide (HYZAAR) 100-25 MG tablet Take 1 tablet by mouth daily. 09/29/23   Jason Leita Repine, FNP  pioglitazone  (ACTOS ) 45 MG tablet Take 1 tablet (45 mg total) by mouth daily. 09/12/23  Shamleffer, Ibtehal Jaralla, MD  tirzepatide  (MOUNJARO ) 10 MG/0.5ML Pen Inject 10 mg into the skin once a week. 12/19/23   Shamleffer, Ibtehal Jaralla, MD    Allergies: Iodine, Lisinopril, Saxagliptin-metformin er, Lansoprazole, Latex, and Sulfamethoxazole-trimethoprim    Review of Systems  Gastrointestinal:  Positive for abdominal pain.    Updated Vital Signs BP (!) 158/56 (BP Location: Right Arm)   Pulse 84   Temp 97.8 F (36.6 C) (Oral)   Resp 18   SpO2 96%   Physical Exam Vitals and nursing note reviewed.  Constitutional:      General: She is  not in acute distress.    Appearance: She is well-developed.  HENT:     Head: Normocephalic and atraumatic.  Eyes:     Conjunctiva/sclera: Conjunctivae normal.  Cardiovascular:     Rate and Rhythm: Normal rate and regular rhythm.     Heart sounds: No murmur heard. Pulmonary:     Effort: Pulmonary effort is normal. No respiratory distress.     Breath sounds: Normal breath sounds.  Abdominal:     Palpations: Abdomen is soft.     Tenderness: There is no abdominal tenderness.  Musculoskeletal:        General: No swelling.     Cervical back: Neck supple.  Skin:    General: Skin is warm and dry.     Capillary Refill: Capillary refill takes less than 2 seconds.  Neurological:     Mental Status: She is alert.     Comments: Patient is alert and oriented. There is no abnormal phonation. Symmetric smile without facial droop. Moves all extremities spontaneously. 5/5 strength in upper and lower extremities. . No sensation deficit. There is no nystagmus. EOMI, PERRL. Coordination intact with finger to nose .    Psychiatric:        Mood and Affect: Mood normal.     (all labs ordered are listed, but only abnormal results are displayed) Labs Reviewed  CBC WITH DIFFERENTIAL/PLATELET - Abnormal; Notable for the following components:      Result Value   RBC 5.14 (*)    Hemoglobin 11.3 (*)    MCV 71.0 (*)    MCH 22.0 (*)    RDW 17.2 (*)    Platelets 459 (*)    All other components within normal limits  COMPREHENSIVE METABOLIC PANEL WITH GFR - Abnormal; Notable for the following components:   Glucose, Bld 163 (*)    AST 13 (*)    All other components within normal limits  URINALYSIS, ROUTINE W REFLEX MICROSCOPIC - Abnormal; Notable for the following components:   Color, Urine COLORLESS (*)    Glucose, UA 250 (*)    Protein, ur 30 (*)    All other components within normal limits  LIPASE, BLOOD  TROPONIN T, HIGH SENSITIVITY    EKG: EKG Interpretation Date/Time:  Monday January 01 2024 11:03:44 EST Ventricular Rate:  74 PR Interval:  162 QRS Duration:  86 QT Interval:  388 QTC Calculation: 430 R Axis:   37  Text Interpretation: Normal sinus rhythm PREVIOUS ECG IS PRESENT Confirmed by Ruthe Cornet [phone]) on 01/01/2024 11:32:43 AM  Radiology: No results found.   Procedures   Medications Ordered in the ED  alum & mag hydroxide-simeth (MAALOX/MYLANTA) 200-200-20 MG/5ML suspension 30 mL (30 mLs Oral Given 01/01/24 1149)  famotidine  (PEPCID ) tablet 20 mg (20 mg Oral Given 01/01/24 1149)  ondansetron  (ZOFRAN ) injection 4 mg (4 mg Intravenous Given 01/01/24 1147)    Clinical Course  as of 01/01/24 1252  Mon Jan 01, 2024  1148 CBC with Differential(!) No leukocytosis, hemoglobin stable [JT]  1155 Urinalysis, Routine w reflex microscopic -Urine, Clean Catch(!) Negative nitrites, negative leukocytes, no bacteria [JT]  1204 Patient evaluated for complaints of epigastric abdominal pain that is worse after eating with associated episodic dizziness.  Is without any other concerning neurological symptoms.  Upon arrival she is hemodynamically stable and nontoxic-appearing.  On exam she she has no abdominal tenderness.  No neurodeficits.  Will begin with routine labs and GI cocktail [JT]  1231 Troponin T, High Sensitivity Without elevation [JT]  1231 Lipase, blood Within normal limits [JT]  1231 Comprehensive metabolic panel(!) No significant abnormality [JT]  1251 Patient reevaluated.  Reports improvement of symptoms.  Lab work is without significant abnormality.  Has a history of PUD and I suspect she may be having a recurrence.  Will send in prescription for famotidine  and Carafate .  She has a known allergy to PPI.  Strict return precautions provided.  Will follow-up PCP. [JT]    Clinical Course User Index [JT] Donnajean Lynwood DEL, PA-C                                 Medical Decision Making Amount and/or Complexity of Data Reviewed Labs: ordered.   This  patient presents to the ED with chief complaint(s) of nausea .  The complaint involves an extensive differential diagnosis and also carries with it a high risk of complications and morbidity.   Pertinent past medical history as listed in HPI  The differential diagnosis includes  Based off exam and history do not suspect appendicitis, pancreatitis, aortic dissection, ACS, PE, CVA or TIA. Additional history obtained: Records reviewed Care Everywhere/External Records  Disposition:   Patient will be discharged home. The patient has been appropriately medically screened and/or stabilized in the ED. I have low suspicion for any other emergent medical condition which would require further screening, evaluation or treatment in the ED or require inpatient management. At time of discharge the patient is hemodynamically stable and in no acute distress. I have discussed work-up results and diagnosis with patient and answered all questions. Patient is agreeable with discharge plan. We discussed strict return precautions for returning to the emergency department and they verbalized understanding.     Social Determinants of Health:   none  This note was dictated with voice recognition software.  Despite best efforts at proofreading, errors may have occurred which can change the documentation meaning.       Final diagnoses:  Epigastric pain    ED Discharge Orders          Ordered    sucralfate  (CARAFATE ) 1 g tablet  3 times daily with meals & bedtime        01/01/24 1248    famotidine  (PEPCID ) 20 MG tablet  2 times daily        01/01/24 1248               Donnajean Lynwood DEL, PA-C 01/01/24 1252    Ruthe Cornet, DO 01/01/24 1307

## 2024-01-02 ENCOUNTER — Other Ambulatory Visit (HOSPITAL_BASED_OUTPATIENT_CLINIC_OR_DEPARTMENT_OTHER): Payer: Self-pay

## 2024-01-12 ENCOUNTER — Other Ambulatory Visit (HOSPITAL_BASED_OUTPATIENT_CLINIC_OR_DEPARTMENT_OTHER): Payer: Self-pay

## 2024-01-23 ENCOUNTER — Other Ambulatory Visit (HOSPITAL_BASED_OUTPATIENT_CLINIC_OR_DEPARTMENT_OTHER): Payer: Self-pay

## 2024-02-07 ENCOUNTER — Other Ambulatory Visit (HOSPITAL_BASED_OUTPATIENT_CLINIC_OR_DEPARTMENT_OTHER): Payer: Self-pay

## 2024-03-22 NOTE — Progress Notes (Incomplete)
{  [id number]}

## 2024-03-25 ENCOUNTER — Encounter: Payer: Self-pay | Admitting: Family Medicine

## 2024-03-25 DIAGNOSIS — Z0000 Encounter for general adult medical examination without abnormal findings: Secondary | ICD-10-CM

## 2024-03-25 DIAGNOSIS — E1169 Type 2 diabetes mellitus with other specified complication: Secondary | ICD-10-CM

## 2024-03-25 DIAGNOSIS — I10 Essential (primary) hypertension: Secondary | ICD-10-CM

## 2024-06-17 ENCOUNTER — Ambulatory Visit: Admitting: Internal Medicine
